# Patient Record
Sex: Female | Born: 1960 | Race: White | Hispanic: No | Marital: Married | Smoking: Former smoker
Health system: Southern US, Community
[De-identification: ages and names within clinical notes are randomized; demographics above are authoritative.]

## PROBLEM LIST (undated history)

## (undated) DIAGNOSIS — M5090 Cervical disc disorder, unspecified, unspecified cervical region: Secondary | ICD-10-CM

## (undated) DIAGNOSIS — K589 Irritable bowel syndrome without diarrhea: Secondary | ICD-10-CM

## (undated) DIAGNOSIS — D0590 Unspecified type of carcinoma in situ of unspecified breast: Secondary | ICD-10-CM

## (undated) DIAGNOSIS — Z923 Personal history of irradiation: Secondary | ICD-10-CM

## (undated) DIAGNOSIS — C4491 Basal cell carcinoma of skin, unspecified: Secondary | ICD-10-CM

## (undated) DIAGNOSIS — M1990 Unspecified osteoarthritis, unspecified site: Secondary | ICD-10-CM

## (undated) DIAGNOSIS — Z1211 Encounter for screening for malignant neoplasm of colon: Secondary | ICD-10-CM

## (undated) DIAGNOSIS — Z1371 Encounter for nonprocreative screening for genetic disease carrier status: Secondary | ICD-10-CM

## (undated) DIAGNOSIS — IMO0002 Reserved for concepts with insufficient information to code with codable children: Secondary | ICD-10-CM

## (undated) DIAGNOSIS — E669 Obesity, unspecified: Secondary | ICD-10-CM

## (undated) DIAGNOSIS — N879 Dysplasia of cervix uteri, unspecified: Secondary | ICD-10-CM

## (undated) DIAGNOSIS — G039 Meningitis, unspecified: Secondary | ICD-10-CM

## (undated) DIAGNOSIS — Z87891 Personal history of nicotine dependence: Secondary | ICD-10-CM

## (undated) DIAGNOSIS — C50919 Malignant neoplasm of unspecified site of unspecified female breast: Secondary | ICD-10-CM

## (undated) DIAGNOSIS — G43909 Migraine, unspecified, not intractable, without status migrainosus: Secondary | ICD-10-CM

## (undated) HISTORY — DX: Dysplasia of cervix uteri, unspecified: N87.9

## (undated) HISTORY — DX: Unspecified osteoarthritis, unspecified site: M19.90

## (undated) HISTORY — DX: Malignant neoplasm of unspecified site of unspecified female breast: C50.919

## (undated) HISTORY — PX: LASIK: SHX215

## (undated) HISTORY — DX: Encounter for nonprocreative screening for genetic disease carrier status: Z13.71

## (undated) HISTORY — PX: MOHS SURGERY: SUR867

## (undated) HISTORY — DX: Meningitis, unspecified: G03.9

## (undated) HISTORY — DX: Migraine, unspecified, not intractable, without status migrainosus: G43.909

## (undated) HISTORY — DX: Encounter for screening for malignant neoplasm of colon: Z12.11

## (undated) HISTORY — DX: Obesity, unspecified: E66.9

## (undated) HISTORY — PX: COLONOSCOPY: SHX174

## (undated) HISTORY — PX: LIPOMA EXCISION: SHX5283

## (undated) HISTORY — DX: Unspecified type of carcinoma in situ of unspecified breast: D05.90

## (undated) HISTORY — PX: BREAST LUMPECTOMY: SHX2

## (undated) HISTORY — PX: SKIN CANCER EXCISION: SHX779

## (undated) HISTORY — PX: CERVICAL DISC SURGERY: SHX588

## (undated) HISTORY — DX: Reserved for concepts with insufficient information to code with codable children: IMO0002

## (undated) HISTORY — DX: Personal history of nicotine dependence: Z87.891

## (undated) HISTORY — DX: Irritable bowel syndrome without diarrhea: K58.9

## (undated) HISTORY — DX: Basal cell carcinoma of skin, unspecified: C44.91

## (undated) HISTORY — DX: Cervical disc disorder, unspecified, unspecified cervical region: M50.90

---

## 1999-03-07 ENCOUNTER — Ambulatory Visit (HOSPITAL_COMMUNITY): Admission: RE | Admit: 1999-03-07 | Discharge: 1999-03-07 | Payer: Self-pay | Admitting: Internal Medicine

## 1999-03-07 ENCOUNTER — Encounter: Payer: Self-pay | Admitting: Internal Medicine

## 1999-05-01 ENCOUNTER — Other Ambulatory Visit: Admission: RE | Admit: 1999-05-01 | Discharge: 1999-05-01 | Payer: Self-pay | Admitting: Obstetrics and Gynecology

## 1999-07-24 ENCOUNTER — Other Ambulatory Visit: Admission: RE | Admit: 1999-07-24 | Discharge: 1999-07-24 | Payer: Self-pay | Admitting: Obstetrics and Gynecology

## 2000-02-26 ENCOUNTER — Encounter (INDEPENDENT_AMBULATORY_CARE_PROVIDER_SITE_OTHER): Payer: Self-pay | Admitting: *Deleted

## 2000-02-26 ENCOUNTER — Ambulatory Visit (HOSPITAL_BASED_OUTPATIENT_CLINIC_OR_DEPARTMENT_OTHER): Admission: RE | Admit: 2000-02-26 | Discharge: 2000-02-26 | Payer: Self-pay | Admitting: General Surgery

## 2000-03-26 ENCOUNTER — Other Ambulatory Visit: Admission: RE | Admit: 2000-03-26 | Discharge: 2000-03-26 | Payer: Self-pay | Admitting: Obstetrics and Gynecology

## 2000-07-23 ENCOUNTER — Other Ambulatory Visit: Admission: RE | Admit: 2000-07-23 | Discharge: 2000-07-23 | Payer: Self-pay | Admitting: Obstetrics and Gynecology

## 2000-11-18 ENCOUNTER — Encounter: Payer: Self-pay | Admitting: Internal Medicine

## 2001-04-12 ENCOUNTER — Other Ambulatory Visit: Admission: RE | Admit: 2001-04-12 | Discharge: 2001-04-12 | Payer: Self-pay | Admitting: Obstetrics and Gynecology

## 2001-06-03 ENCOUNTER — Encounter (INDEPENDENT_AMBULATORY_CARE_PROVIDER_SITE_OTHER): Payer: Self-pay | Admitting: Specialist

## 2001-06-03 ENCOUNTER — Ambulatory Visit (HOSPITAL_COMMUNITY): Admission: RE | Admit: 2001-06-03 | Discharge: 2001-06-03 | Payer: Self-pay | Admitting: Obstetrics and Gynecology

## 2001-09-23 ENCOUNTER — Other Ambulatory Visit: Admission: RE | Admit: 2001-09-23 | Discharge: 2001-09-23 | Payer: Self-pay | Admitting: Obstetrics and Gynecology

## 2002-02-10 ENCOUNTER — Ambulatory Visit (HOSPITAL_COMMUNITY): Admission: RE | Admit: 2002-02-10 | Discharge: 2002-02-10 | Payer: Self-pay | Admitting: Sports Medicine

## 2002-02-10 ENCOUNTER — Encounter: Payer: Self-pay | Admitting: Sports Medicine

## 2002-09-27 ENCOUNTER — Other Ambulatory Visit: Admission: RE | Admit: 2002-09-27 | Discharge: 2002-09-27 | Payer: Self-pay | Admitting: Obstetrics and Gynecology

## 2003-08-04 DIAGNOSIS — D0590 Unspecified type of carcinoma in situ of unspecified breast: Secondary | ICD-10-CM

## 2003-08-04 DIAGNOSIS — K589 Irritable bowel syndrome without diarrhea: Secondary | ICD-10-CM

## 2003-08-04 HISTORY — DX: Unspecified type of carcinoma in situ of unspecified breast: D05.90

## 2003-08-04 HISTORY — PX: BREAST BIOPSY: SHX20

## 2003-08-04 HISTORY — DX: Irritable bowel syndrome, unspecified: K58.9

## 2003-09-01 ENCOUNTER — Emergency Department (HOSPITAL_COMMUNITY): Admission: EM | Admit: 2003-09-01 | Discharge: 2003-09-01 | Payer: Self-pay | Admitting: Emergency Medicine

## 2003-11-20 ENCOUNTER — Other Ambulatory Visit: Admission: RE | Admit: 2003-11-20 | Discharge: 2003-11-20 | Payer: Self-pay | Admitting: Obstetrics and Gynecology

## 2004-04-03 HISTORY — PX: BREAST LUMPECTOMY W/ NEEDLE LOCALIZATION: SHX1266

## 2004-05-08 ENCOUNTER — Ambulatory Visit: Payer: Self-pay | Admitting: Radiation Oncology

## 2004-05-19 ENCOUNTER — Ambulatory Visit: Payer: Self-pay | Admitting: General Surgery

## 2004-06-03 ENCOUNTER — Ambulatory Visit: Payer: Self-pay | Admitting: Radiation Oncology

## 2004-07-03 ENCOUNTER — Ambulatory Visit: Payer: Self-pay | Admitting: Radiation Oncology

## 2004-08-03 ENCOUNTER — Ambulatory Visit: Payer: Self-pay | Admitting: Radiation Oncology

## 2004-08-03 HISTORY — PX: ABDOMINAL HYSTERECTOMY: SHX81

## 2004-10-01 ENCOUNTER — Ambulatory Visit: Payer: Self-pay | Admitting: General Surgery

## 2004-12-02 ENCOUNTER — Other Ambulatory Visit: Admission: RE | Admit: 2004-12-02 | Discharge: 2004-12-02 | Payer: Self-pay | Admitting: Obstetrics and Gynecology

## 2005-03-16 ENCOUNTER — Encounter (INDEPENDENT_AMBULATORY_CARE_PROVIDER_SITE_OTHER): Payer: Self-pay | Admitting: Specialist

## 2005-03-16 ENCOUNTER — Inpatient Hospital Stay (HOSPITAL_COMMUNITY): Admission: AD | Admit: 2005-03-16 | Discharge: 2005-03-18 | Payer: Self-pay | Admitting: Obstetrics and Gynecology

## 2005-04-10 ENCOUNTER — Ambulatory Visit: Payer: Self-pay | Admitting: General Surgery

## 2005-04-21 ENCOUNTER — Ambulatory Visit: Payer: Self-pay | Admitting: General Surgery

## 2005-06-01 ENCOUNTER — Ambulatory Visit: Payer: Self-pay | Admitting: Internal Medicine

## 2005-10-14 ENCOUNTER — Ambulatory Visit: Payer: Self-pay | Admitting: General Surgery

## 2006-03-01 ENCOUNTER — Other Ambulatory Visit: Admission: RE | Admit: 2006-03-01 | Discharge: 2006-03-01 | Payer: Self-pay | Admitting: Obstetrics and Gynecology

## 2006-05-03 ENCOUNTER — Ambulatory Visit: Payer: Self-pay | Admitting: General Surgery

## 2006-05-06 ENCOUNTER — Ambulatory Visit: Payer: Self-pay | Admitting: Rheumatology

## 2006-11-10 ENCOUNTER — Ambulatory Visit: Payer: Self-pay | Admitting: General Surgery

## 2007-05-02 ENCOUNTER — Other Ambulatory Visit: Admission: RE | Admit: 2007-05-02 | Discharge: 2007-05-02 | Payer: Self-pay | Admitting: Obstetrics and Gynecology

## 2007-05-04 ENCOUNTER — Ambulatory Visit: Payer: Self-pay | Admitting: General Surgery

## 2008-03-12 ENCOUNTER — Ambulatory Visit: Payer: Self-pay | Admitting: General Surgery

## 2008-04-13 DIAGNOSIS — K589 Irritable bowel syndrome without diarrhea: Secondary | ICD-10-CM | POA: Insufficient documentation

## 2008-04-13 DIAGNOSIS — C50919 Malignant neoplasm of unspecified site of unspecified female breast: Secondary | ICD-10-CM | POA: Insufficient documentation

## 2008-04-13 DIAGNOSIS — N809 Endometriosis, unspecified: Secondary | ICD-10-CM | POA: Insufficient documentation

## 2008-04-16 ENCOUNTER — Ambulatory Visit: Payer: Self-pay | Admitting: Internal Medicine

## 2008-05-11 ENCOUNTER — Ambulatory Visit: Payer: Self-pay | Admitting: Internal Medicine

## 2008-06-26 ENCOUNTER — Telehealth: Payer: Self-pay | Admitting: Internal Medicine

## 2008-08-03 DIAGNOSIS — C4491 Basal cell carcinoma of skin, unspecified: Secondary | ICD-10-CM

## 2008-08-03 HISTORY — DX: Basal cell carcinoma of skin, unspecified: C44.91

## 2008-08-03 LAB — CONVERTED CEMR LAB: Pap Smear: NORMAL

## 2008-11-16 ENCOUNTER — Ambulatory Visit: Payer: Self-pay | Admitting: Internal Medicine

## 2008-11-16 LAB — CONVERTED CEMR LAB
ALT: 20 units/L (ref 0–35)
AST: 21 units/L (ref 0–37)
Albumin: 3.5 g/dL (ref 3.5–5.2)
Alkaline Phosphatase: 63 units/L (ref 39–117)
BUN: 13 mg/dL (ref 6–23)
Basophils Absolute: 0 10*3/uL (ref 0.0–0.1)
Basophils Relative: 0 % (ref 0.0–3.0)
Bilirubin Urine: NEGATIVE
Bilirubin, Direct: 0.1 mg/dL (ref 0.0–0.3)
CO2: 33 meq/L — ABNORMAL HIGH (ref 19–32)
Calcium: 9.3 mg/dL (ref 8.4–10.5)
Chloride: 106 meq/L (ref 96–112)
Cholesterol: 174 mg/dL (ref 0–200)
Creatinine, Ser: 0.8 mg/dL (ref 0.4–1.2)
Eosinophils Absolute: 0.2 10*3/uL (ref 0.0–0.7)
Eosinophils Relative: 3.3 % (ref 0.0–5.0)
GFR calc non Af Amer: 81.28 mL/min (ref >60)
Glucose, Bld: 90 mg/dL (ref 70–99)
HCT: 39.2 % (ref 36.0–46.0)
HDL: 70.3 mg/dL (ref >39.00)
Hemoglobin, Urine: NEGATIVE
Hemoglobin: 13.5 g/dL (ref 12.0–15.0)
Ketones, ur: NEGATIVE mg/dL
LDL Cholesterol: 72 mg/dL (ref 0–99)
Leukocytes, UA: NEGATIVE
Lymphocytes Relative: 33.8 % (ref 12.0–46.0)
Lymphs Abs: 1.9 10*3/uL (ref 0.7–4.0)
MCHC: 34.4 g/dL (ref 30.0–36.0)
MCV: 90.3 fL (ref 78.0–100.0)
Monocytes Absolute: 0.6 10*3/uL (ref 0.1–1.0)
Monocytes Relative: 11.2 % (ref 3.0–12.0)
Neutro Abs: 2.9 10*3/uL (ref 1.4–7.7)
Neutrophils Relative %: 51.7 % (ref 43.0–77.0)
Nitrite: NEGATIVE
Platelets: 233 10*3/uL (ref 150.0–400.0)
Potassium: 4.5 meq/L (ref 3.5–5.1)
RBC: 4.34 M/uL (ref 3.87–5.11)
RDW: 12 % (ref 11.5–14.6)
Sodium: 144 meq/L (ref 135–145)
Specific Gravity, Urine: 1.01 (ref 1.000–1.030)
TSH: 1.43 microintl units/mL (ref 0.35–5.50)
Total Bilirubin: 0.5 mg/dL (ref 0.3–1.2)
Total CHOL/HDL Ratio: 2
Total Protein, Urine: NEGATIVE mg/dL
Total Protein: 6.5 g/dL (ref 6.0–8.3)
Triglycerides: 159 mg/dL — ABNORMAL HIGH (ref 0.0–149.0)
Urine Glucose: NEGATIVE mg/dL
Urobilinogen, UA: 0.2 (ref 0.0–1.0)
VLDL: 31.8 mg/dL (ref 0.0–40.0)
WBC: 5.6 10*3/uL (ref 4.5–10.5)
pH: 5.5 (ref 5.0–8.0)

## 2008-11-20 ENCOUNTER — Encounter: Payer: Self-pay | Admitting: Internal Medicine

## 2008-11-20 ENCOUNTER — Ambulatory Visit: Payer: Self-pay | Admitting: Internal Medicine

## 2008-11-20 ENCOUNTER — Telehealth: Payer: Self-pay | Admitting: Internal Medicine

## 2008-11-20 DIAGNOSIS — R5383 Other fatigue: Secondary | ICD-10-CM

## 2008-11-20 DIAGNOSIS — M545 Low back pain, unspecified: Secondary | ICD-10-CM | POA: Insufficient documentation

## 2008-11-20 DIAGNOSIS — R030 Elevated blood-pressure reading, without diagnosis of hypertension: Secondary | ICD-10-CM | POA: Insufficient documentation

## 2008-11-20 DIAGNOSIS — R5381 Other malaise: Secondary | ICD-10-CM | POA: Insufficient documentation

## 2008-11-20 DIAGNOSIS — R059 Cough, unspecified: Secondary | ICD-10-CM | POA: Insufficient documentation

## 2008-11-20 DIAGNOSIS — R05 Cough: Secondary | ICD-10-CM

## 2008-11-20 DIAGNOSIS — F411 Generalized anxiety disorder: Secondary | ICD-10-CM | POA: Insufficient documentation

## 2008-12-14 ENCOUNTER — Telehealth (INDEPENDENT_AMBULATORY_CARE_PROVIDER_SITE_OTHER): Payer: Self-pay | Admitting: *Deleted

## 2008-12-14 ENCOUNTER — Encounter (INDEPENDENT_AMBULATORY_CARE_PROVIDER_SITE_OTHER): Payer: Self-pay | Admitting: *Deleted

## 2008-12-14 DIAGNOSIS — R1084 Generalized abdominal pain: Secondary | ICD-10-CM | POA: Insufficient documentation

## 2009-01-09 ENCOUNTER — Telehealth: Payer: Self-pay | Admitting: Internal Medicine

## 2009-03-14 ENCOUNTER — Ambulatory Visit: Payer: Self-pay | Admitting: General Surgery

## 2009-03-26 ENCOUNTER — Encounter: Payer: Self-pay | Admitting: Internal Medicine

## 2009-04-02 ENCOUNTER — Encounter: Payer: Self-pay | Admitting: Internal Medicine

## 2009-04-02 ENCOUNTER — Ambulatory Visit: Payer: Self-pay | Admitting: General Surgery

## 2009-04-10 ENCOUNTER — Encounter: Payer: Self-pay | Admitting: Internal Medicine

## 2010-02-24 ENCOUNTER — Ambulatory Visit: Payer: Self-pay | Admitting: Unknown Physician Specialty

## 2010-03-24 ENCOUNTER — Ambulatory Visit: Payer: Self-pay | Admitting: General Surgery

## 2010-12-19 NOTE — Op Note (Signed)
[facility]  Patient:    Jacqueline Lewis, Jacqueline Lewis Visit Number: [id number] MRN: [id number]          Service Type: DSU Location: [facility] Attending Physician:  Leonard Schwartz Dictated by:   Janine Limbo, M.D. Proc. Date: 06/03/01 Admit Date:  06/03/2001 Discharge Date: 06/03/2001                             Operative Report  PREOPERATIVE DIAGNOSES:         1. Chronic pelvic pain.                                 2. Irregular menstrual cycles.                                 3. Endometriosis.                                 4. Pelvic adhesions.  POSTOPERATIVE DIAGNOSES:        1. Chronic pelvic pain.                                 2. Irregular menstrual cycles.                                 3. Endometriosis.                                 4. Pelvic adhesions.  OPERATION:                      1. Hysteroscopy.                                 2. Dilatation and curettage.                                 3. Diagnostic laparoscopy.                                 4. Laparoscopic pelvic biopsies.                                 5. Laparoscopic resection of                                    endometriosis.                                 6. Laparoscopic ablation of                                    endometriosis.  7. Laparoscopic lysis of                                    adhesions.  SURGEON:                        Janine Limbo, M.D.  FIRST ASSISTANT:                None.  ANESTHESIA:                     General.  INDICATIONS:                    Jacqueline Lewis is a 50 year old female, gravida 0 who presents with the above mentioned diagnosis.  The pain medications have not resolved her discomfort.  The patient understands the indications for her surgical procedure and she accepts the risks of, but not limited to, anesthetic complications, bleeding, infections, and possible damage to the surrounding  organs.  FINDINGS:                       The uterus was normal size.  The fallopian tubes were normal except for adhesions between the left fallopian tube and the bowel.  The fimbriated ends of the fallopian tubes were delicate.  The ovaries were normal.  There were, however, multiple implants of endometriosis on the surface of the ovaries.  The appendix appeared normal.  The liver was normal. The gallbladder appeared slightly distended but was thought to be within normal limits.  There were adhesions between the large bowel and the left broad ligament.  There were adhesions between the large bowel and the right broad ligament.  There was an implant of endometriosis in the anterior cul-de-sac measuring approximately 1 cm in size.  It was to the left of the midline.  There were implants of endometriosis in the posterior cul-de-sac. There was a lesion measuring 3.0 cm and it was located to the left of the uterosacral ligament.  There were smaller implants of endometriosis in the posterior cul-de-sac to the left of the uterosacral ligament.  There were areas of endometriosis in the posterior cul-de-sac to the right of the uterosacral ligament measuring approximately 0.3 cm in size.  There were no adhesions in the posterior cul-de-sac per say.  There were no adhesions in the anterior cul-de-sac per say.  The fallopian tubes and the ovaries were freely mobile in spite of the endometriosis present.  At the end of our procedure, the major areas of endometriosis had been resected.  The adhesions had been lysed.  The ureters were identified and followed up through their course in the pelvis and were noted to be without harm.  On hysteroscopy there were no pathologic appearing lesions noted within the endometrial cavity.  There was an abundance of endometrial tissue located posteriorly in the uterus and following dilatation and curettage, the cavity was felt to be clean.  DESCRIPTION OF PROCEDURE:   The patient was taken to the operating room where a general anesthetic was given.  The patients abdomen, perineum, and vagina were prepped with multiple layers of Betadine.  The patients abdomen, perineum, and vagina were prepped with multiple layers of Betadine.  A Foley catheter was placed in the bladder.  The patient was sterilely draped. Examination under anesthesia was performed.  A paracervical block was placed  using 10 cc of 0.5% Marcaine without epinephrine.  An endocervical curettage was obtained.  The uterus was sounded to 7.5 cm.  The cervix was gradually dilated.  The diagnostic hysteroscope was inserted and the cavity was inspected.  Findings are mentioned above.  Pictures were taken of the patients intracavitary anatomy.  The hysteroscope was removed, and the cavity was curetted using a medium sharp curet.  The cavity was felt to be clean at the end of our procedure.  The hysteroscope was again inserted, and I confirmed that the cavity was basically clean.  Again, there were no signs of pathology within the uterine cavity.  These instruments were removed, and a Hulka tenaculum was placed inside the uterus.  The operator changed gloves and we proceeded with our diagnostic laparoscopy.  The subumbilical area was injected with 5 cc of 0.25% Marcaine.  An incision was made and the Verres needle was inserted into the abdominal cavity without difficulty.  Proper placement was confirmed using the saline drop test.  A pneumoperitoneum was then obtained.  The laparoscopic trocar and then the laparoscope were substituted for the Verres needle.  The pelvic structures were visualized with findings as mentioned above.  The suprapubic area was then injected in two separate places.  Two incisions were made and two 5 mm trocars were placed into the pelvis.  Pictures were taken of the patients pelvic anatomy.  The endometriosis noted above was documented.  The areas of endometriosis in  the anterior cul-de-sac were then injected with saline.  The areas were biopsied away using sharp biopsy instruments.  Hemostasis was adequate.  The  endometrial implant located to the left of the uterosacral ligament posteriorly was then injected with saline.  A biopsy was obtained.  Hemostasis was adequate.  An area of endometriosis in the right cul-de-sac was then injected.  A biopsy was obtained and again hemostasis was noted to be adequate.  Care was taken not to damage the underlying vital structures.  We then used the harmonic scalpel to ablate the smaller foci of endometriosis located in the anterior cul-de-sac and in the posterior cul-de-sac.  The adhesions between the bowel and the side walls were then lysed using a combination of sharp dissection, blunt dissection and dissection using the harmonic scalpel.  The pelvis was then irrigated.  At this point, we felt that our surgery had been completed.  There was no evidence of bleeding.  There was no evidence of damage to the bowel or any of the structures.  The pneumoperitoneum was allowed to escape.  The incisions were closed using deep and superficial sutures of 4-0 Vicryl.  Sponge, needle and instrument counts were correct on two occasions.  The estimated blood loss was 20 cc.  The patient tolerated her procedure well.  The patient was taken to the recovery room after her anesthetic was reversed.  She was noted to be in stable condition.  She was noted to be draining clear yellow urine.  FOLLOW-UP INSTRUCTIONS:  The patient will return to see Dr. Stefano Gaul in two to three weeks for follow-up examination.  She was given a prescription for Vicodin which she will take one or two tablets every four hours as needed for pain.  She was given a copy of the postoperative instruction sheet as prepared by the [facility] for patients who have undergone a diagnostic laparoscopy as well as dilatation and curettage.  She  will call for questions or concerns. Dictated by:   Merton Border  Zack Seal, M.D. Attending Physician:  Leonard Schwartz DD:  06/04/01 TD:  06/05/01 Job: [id number]

## 2010-12-19 NOTE — H&P (Signed)
NAME:  Jacqueline Lewis, Jacqueline Lewis             ACCOUNT NO.:  [id number]   MEDICAL RECORD NO.:  [id number]          PATIENT TYPE:  INP   LOCATION:  NA                            FACILITY:  [facility]   PHYSICIAN:  James A. Ashley Royalty, M.D.DATE OF BIRTH:  [birth year]   DATE OF ADMISSION:  03/16/2005  DATE OF DISCHARGE:                                HISTORY & PHYSICAL   HISTORY OF PRESENT ILLNESS:  This is a 50 year old nulligravida who  presented to me May 2006 complaining of history of endometriosis and severe  dysmenorrhea.  She also has breast cancer-estrogen receptor positive.  She  desires definitive therapy for the endometriosis in the form of TAH BSO.  She also states that she wants to take Arimidex, antiestrogen, and that this  definitive procedure wound depend on her Jacqueline Lewis Oncologist to make that  possible as well.   MEDICATIONS:  1.  Xanax p.r.n.  2.  Mobic (?) 15 mg.  3.  Ponstel 250 mg p.o. t.i.d. p.r.n.  4.  Levsin for irritable bowel syndrome.   PAST MEDICAL HISTORY:  1.  Irritable bowel syndrome.  2.  Breast carcinoma.  3.  Degenerative joint disease.   PAST SURGICAL HISTORY:  1.  LEEP.  2.  Laparoscopy.  3.  Breast surgery (lumpectomy).  4.  Lipoma.   ALLERGIES:  None.   FAMILY HISTORY:  Noncontributory.   SOCIAL HISTORY:  The patient is a one pack per day smoker.  She is a social  drinker.   REVIEW OF SYSTEMS:  Noncontributory.   PHYSICAL EXAMINATION:  GENERAL APPEARANCE:  Well-developed, well-nourished,  pleasant white female in no acute distress.  VITAL SIGNS:  Afebrile, vital signs stable.  SKIN:  Warm and dry without lesions.  LYMPH:  No supraclavicular, cervical or inguinal adenopathy.  HEENT:  Normocephalic, atraumatic.  NECK:  Supple without thyromegaly.  CHEST:  Lungs are clear.  CARDIAC:  Regular rate and rhythm.  BREAST:  Deferred.  ABDOMEN:  Soft and tenderness without masses or organomegaly.  Bowel sounds  are active.  MUSCULOSKELETAL:  Full range of  motion.  PELVIC:  External genitalia within normal limits.  Vaginal and cervix are  without lesions.  Bimanual examination reveals the uterus to be  approximately 8x4x4 cm.  No adnexal masses are palpable.   IMPRESSION:  1.  Endometriosis.  2.  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  3.  Dysmenorrhea-severe-probably secondary to #1.  4.  Back pain, probably secondary to #1.   PLAN:  1.  Total abdominal hysterectomy.  2.  Bilateral salpingo-oophorectomy.  Benefits, complications and      alternatives were discussed with the patient.  She states she      understands and accepts.  Questions invited and answered.       JAM/MEDQ  D:  03/16/2005  T:  03/16/2005  Job:  [id number]
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
## 2010-12-19 NOTE — Op Note (Signed)
[facility]  Patient:    Jacqueline Lewis, Jacqueline Lewis                      MRN: [id number] Proc. Date: 02/26/00 Adm. Date:  [id number] Disc. Date: [id number] Attending:  Carson Myrtle                           Operative Report  PREOPERATIVE DIAGNOSIS:  Lipoma right back.  POSTOPERATIVE DIAGNOSIS:  Lipoma right back.  SURGEON:  Timothy E. Earlene Plater, M.D.  PROCEDURE:  Excision of lipoma.  ANESTHESIA:  CRNA standby.  BRIEF HISTORY:  Ms. Curiale is an otherwise healthy 50 year old female who has an enlarging painful mass in the right lower back.  It appears to be a lipoma.  She has requested removal, and this has been planned for today.  PROCEDURE IN DETAIL:  The patient was brought to the operating room and placed supine, IV started, and sedation given.  She was then turned prone and she comforted herself and was carefully padded.  More IV sedation was given.  The right back had been marked to her and my satisfaction, and it was prepped and draped in the usual fashion.  Marcaine 0.25% with epinephrine was used throughout for local anesthesia.  Over the palpable defect, Marcaine was injected.  Then a straight incision was made horizontally.  The mass was identified in the deep subcutaneous space as a large globular lipoma, approximately 3 x 4 cm.  This was bluntly dissected from the surrounding tissue.  Cautery was used when necessary, and the lipoma was removed.  The space was dry.  It was partially closed with a 3-0 Monocryl.  The skin was closed with 3-0 Monocryl.  She tolerated it well.  Steri-Strips and dry sterile dressings were applied.  Written and verbal instructions were given her and her husband, and she will be seen and followed as an outpatient. DD:  02/26/00 TD:  02/27/00 Job: [id number]

## 2010-12-19 NOTE — Op Note (Signed)
NAME:  Jacqueline Lewis, Jacqueline Lewis             ACCOUNT NO.:  [id number]   MEDICAL RECORD NO.:  [id number]          PATIENT TYPE:  INP   LOCATION:  9399                          FACILITY:  [facility]   PHYSICIAN:  Rudy Jew. Ashley Royalty, M.D.DATE OF BIRTH:  11-02-1960   DATE OF PROCEDURE:  03/16/2005  DATE OF DISCHARGE:                                 OPERATIVE REPORT   PREOPERATIVE DIAGNOSES:  1.  Endometriosis.  2.  Breast carcinoma.   POSTOP DIAGNOSIS:  1.  Endometriosis.  2.  Breast carcinoma.  Pathology pending.   PROCEDURE:  1.  Total abdominal hysterectomy.  2.  Bilateral salpingo-oophorectomy.   SURGEON:  Rudy Jew. Ashley Royalty, M.D.   ASSISTANT:  Bing Neighbors. Delcambre, MD   ANESTHESIA:  General.   ESTIMATED BLOOD LOSS:  150 mL.   COMPLICATIONS:  None.   PACKS AND DRAINS:  Foley.   Sponge, needle and instrument counts were reported as correct x 2.   PROCEDURE:  The patient was taken to the operating room, placed in dorsal  supine position. After general anesthetic was administered, she was prepped  for abdominal surgery. The vagina was prepped as well. Foley catheter was  placed. She was then draped for surgery.   A Pfannenstiel incision was made down to the level of the fascia which was  nicked with knife and incised transversely with Mayo scissors. The  underlying rectus muscles were separated from the overlying fascia using  sharp and blunt dissection. The rectus muscles were separated in the midline  exposing the peritoneum was elevated with hemostats and entered  atraumatically with Metzenbaum scissors. Incision was extended  longitudinally. The pelvis was identified and the uterus was noted to be  quite small. The ovaries, fallopian tubes were also quite small. There were  some adhesions from the colon to the left pelvic sidewall that required  lysis prior to packing the GI tract cephalad. This was accomplished without  difficulty. The Balfour retractor was then placed and the  upper abdomen  packed off. Prior to packing the upper abdomen off, it was inspected. The  peritoneum was smooth. There was no appreciable adenopathy. The liver  surface was smooth as well. Next the uterus was grasped with Tresa Endo clamps.  The round ligaments were clamped, cut and secured bilaterally with 0 Vicryl.  The broad ligament was opened and the bladder flap created by incising  anterior uterine serosa and sharp and blunt dissection of bladder  inferiorly. The infundibulopelvic ligaments were isolated. The ureters were  noted to be well below the plane of dissection. The infundibulopelvic  ligaments were bilaterally clamped, cut and secured with zero Vicryl. The  uterine vessels were skeletonized and bilaterally clamped, cut and secured  with #1 Vicryl. The cardinal ligaments were then clamped, cut and secured  with 0 Vicryl. Uterosacral ligaments were serially clamped, cut and secured.  The vaginal angles were secured with Heaneys and the vagina entered. The  cervix was excised with Satinsky scissors and the entire specimen submitted  to pathology for histologic studies. Angle sutures were placed using 0  Vicryl. One interrupted figure-of-eight suture was required in  the midline  to completely close the vagina. This was accomplished without difficulty and  hemostasis was noted. Copious irrigation was accomplished. Small bleeder on  the left angle was easily controlled with a right-angle clamp and secured  with 0 Vicryl.   At this point all pedicles were inspected and hemostasis was noted. Copious  irrigation was accomplished. Hemostasis was once again noted. All packs were  removed and the retractor was removed. The peritoneum was closed with a 3-0  Vicryl in a running fashion. The fascia was closed with 0 Vicryl in a  running fashion. The skin was closed with staples. The patient tolerated  procedure extremely well and was returned to the recovery room in good  condition.   At  the conclusion of the procedure, the urine was clear and copious.       JAM/MEDQ  D:  03/16/2005  T:  03/16/2005  Job:  [id number]

## 2010-12-19 NOTE — Discharge Summary (Signed)
NAME:  Jacqueline Lewis, CABAL             ACCOUNT NO.:  [id number]   MEDICAL RECORD NO.:  [id number]          PATIENT TYPE:  INP   LOCATION:  9318                          FACILITY:  [facility]   PHYSICIAN:  Rudy Jew. Ashley Royalty, M.D.DATE OF BIRTH:  12-Oct-1960   DATE OF ADMISSION:  03/16/2005  DATE OF DISCHARGE:  03/18/2005                                 DISCHARGE SUMMARY   DISCHARGE DIAGNOSES:  1.  Endometriosis.  2.  Dysmenorrhea, severe.  3.  Back pain, probably secondary to endometriosis.  4.  Left-sided breast cancer, ductal carcinoma in situ.   OPERATION/PROCEDURE:  1.  Total abdominal hysterectomy.  2.  Bilateral salpingo-oophorectomy.   CONSULTATIONS:  None.   DISCHARGE MEDICATIONS:  Percocet, Motrin.   HISTORY AND PHYSICAL:  This is a 50 year old nulligravida who presented in  May 2006 complaining of endometriosis with severe dysmenorrhea.  She also  has breast cancer, estrogen-receptor positive.  She desires to undergo for  the endometriosis in the form of TAHBSO.  She also states that she wants to  take Arimidex and this procedure will allow that to be administered by her  medical oncologist as well.  For the remainder of the history and physical,  please see the chart.   HOSPITAL COURSE:  The patient is admitted to [facility] [place].  Admission laboratory studies were drawn.  On March 16, 2005, she was taken  to the operating room and underwent total abdominal hysterectomy, bilateral  salpingo-oophorectomy.  The procedure was uncomplicated.  The patient's  postoperative course was similarly uncomplicated.  She was discharged on the  second postoperative day afebrile in satisfactory condition.  The pathology  report revealed endometriosis, chronic cervicitis.   DISPOSITION:  The patient is to return to the office in approximately two  days for staple removal.           ______________________________  Rudy Jew. Ashley Royalty, M.D.     JAM/MEDQ  D:  05/25/2005  T:   05/26/2005  Job:  [id number]

## 2011-04-30 ENCOUNTER — Ambulatory Visit: Payer: Self-pay | Admitting: Gynecology

## 2011-06-03 ENCOUNTER — Other Ambulatory Visit: Payer: Self-pay | Admitting: Gynecology

## 2011-08-04 DIAGNOSIS — Z1211 Encounter for screening for malignant neoplasm of colon: Secondary | ICD-10-CM

## 2011-08-04 HISTORY — DX: Encounter for screening for malignant neoplasm of colon: Z12.11

## 2012-05-25 ENCOUNTER — Ambulatory Visit: Payer: Self-pay | Admitting: General Surgery

## 2012-10-18 ENCOUNTER — Ambulatory Visit (INDEPENDENT_AMBULATORY_CARE_PROVIDER_SITE_OTHER): Payer: BC Managed Care – PPO | Admitting: Gynecology

## 2012-10-18 ENCOUNTER — Encounter: Payer: Self-pay | Admitting: Gynecology

## 2012-10-18 VITALS — BP 136/84 | Ht 65.0 in | Wt 158.0 lb

## 2012-10-18 DIAGNOSIS — C801 Malignant (primary) neoplasm, unspecified: Secondary | ICD-10-CM | POA: Insufficient documentation

## 2012-10-18 DIAGNOSIS — Z01419 Encounter for gynecological examination (general) (routine) without abnormal findings: Secondary | ICD-10-CM

## 2012-10-18 DIAGNOSIS — N879 Dysplasia of cervix uteri, unspecified: Secondary | ICD-10-CM | POA: Insufficient documentation

## 2012-10-18 DIAGNOSIS — G4700 Insomnia, unspecified: Secondary | ICD-10-CM

## 2012-10-18 MED ORDER — ALPRAZOLAM 0.5 MG PO TABS: 0.5000 mg | ORAL_TABLET | Freq: Every evening | ORAL | Status: DC | PRN

## 2012-10-18 MED ORDER — TRAZODONE HCL 50 MG PO TABS: 50.0000 mg | ORAL_TABLET | Freq: Every day | ORAL | Status: DC

## 2012-10-18 NOTE — Patient Instructions (Signed)
Follow up in one year for annual exam 

## 2012-10-18 NOTE — Progress Notes (Signed)
Jacqueline Lewis April 24, 1961 [id number]        53 y.o.  G0P0 new patient for annual exam.  Former patient of Dr. Nicholas Lose. Several issues noted below.  Past medical history,surgical history, medications, allergies, family history and social history were all reviewed and documented in the EPIC chart. ROS:  Was performed and pertinent positives and negatives are included in the history.  Exam: Kim assistant Filed Vitals:   10/18/12 1539  BP: 136/84  Height: 5\' 5"  (1.651 m)  Weight: 158 lb (71.668 kg)   General appearance  Normal Skin grossly normal Head/Neck normal with no cervical or supraclavicular adenopathy thyroid normal Lungs  clear Cardiac RR, without RMG Abdominal  soft, nontender, without masses, organomegaly or hernia Breasts  examined lying and sitting without masses, retractions, discharge or axillary adenopathy. Well-healed lumpectomy scar left breast. Pelvic  Ext/BUS/vagina  normal with mild atrophic changes   Adnexa  Without masses or tenderness    Anus and perineum  normal   Rectovaginal  normal sphincter tone without palpated masses or tenderness.    Assessment/Plan:  52 y.o. G0P0 new patient for annual exam.   1. History of breast cancer status post lumpectomy/radiation treatment 2005. No subsequent therapy. Doing well with NED. Mammography 03/2012. Continue with annual mammography. SBE monthly reviewed. 2. Status post TAH/BSO 2006. Historically sounds like endometriosis. Is doing well without significant hot flushes sweats or vaginal symptoms. Continue to monitor. 3. Pap smear 2012. No Pap smear done today. History of dysplasia as a younger woman requiring repeat Pap smears but no treatment. All Paps after her hysterectomy have been normal. We'll plan repeat next year at three-year interval, discussed possible stop screening altogether. 4. Colonoscopy 2009. Repeat at 10 year interval. 5. DEXA never. Plan Baseline in another year or 2 at 10 years from her hysterectomy  BSO. Increase calcium vitamin D reviewed. 6. Chronic insomnia. Has had a problem with chronic insomnia ever since her radiation treatment. Haven't tried a variety of medications including Ambien Lunesta which caused side effects. Currently on trazodone 50 mg nightly per Dr. Nicholas Lose and doing well with this. Does use Xanax when necessary. Is comfortable continuing. Discussed possible sleep clinic referral. Patient would prefer to continue on medication at this time. Apparently also been addressed by her oncologist whose was comfortable with the regiment.  Refill trazodone #90 with 4 refills. Xanax 0.5 mg #30 with 4 refills. 7. Health maintenance. Patient having blood work screening at work. The blood work done today. Blood pressure 136/84. Aspiration have recheck in a not exam situation. It would remain elevated she will need to see her primary. Assuming she continues well from a gynecologic standpoint and followup in one year, sooner as needed.     Dara Lords MD, 4:38 PM 10/18/2012

## 2012-10-19 LAB — URINALYSIS W MICROSCOPIC + REFLEX CULTURE
Glucose, UA: NEGATIVE mg/dL
Leukocytes, UA: NEGATIVE
Protein, ur: NEGATIVE mg/dL
Squamous Epithelial / LPF: NONE SEEN
Urobilinogen, UA: 0.2 mg/dL (ref 0.0–1.0)

## 2012-10-23 ENCOUNTER — Other Ambulatory Visit: Payer: Self-pay | Admitting: Gynecology

## 2012-10-24 ENCOUNTER — Telehealth: Payer: Self-pay

## 2012-10-24 NOTE — Telephone Encounter (Signed)
Patient was in march 18 and was prescribed this but the Rx went to "print" and cannot be electronically sent.  Rx was given to pharmacist by phone today.

## 2012-10-24 NOTE — Telephone Encounter (Signed)
Patient called to say that she went to pharmacy after her visit and her Trazadone was there but not her Xanax. I told her I spoke with pharmacy this morning and gave them the Rx for Xanax.  I explained to her that Xanax will not go electronically and that it was sent to print . I told her if she received the written one she needed to shred it and she said she would.

## 2012-12-01 HISTORY — PX: NASAL SEPTUM SURGERY: SHX37

## 2012-12-07 ENCOUNTER — Ambulatory Visit: Payer: Self-pay | Admitting: Otolaryngology

## 2013-01-23 ENCOUNTER — Encounter: Payer: Self-pay | Admitting: *Deleted

## 2013-01-23 DIAGNOSIS — D0590 Unspecified type of carcinoma in situ of unspecified breast: Secondary | ICD-10-CM | POA: Insufficient documentation

## 2013-02-24 ENCOUNTER — Telehealth: Payer: Self-pay | Admitting: *Deleted

## 2013-02-24 NOTE — Telephone Encounter (Signed)
Pt called was given Desyrel 50 mg 1 po daily, on OV 10/18/12  but she takes twice nightly and has always taken this way. Pt asked if this could be written 2 tablets night? Please advise

## 2013-02-27 MED ORDER — TRAZODONE HCL 50 MG PO TABS: ORAL_TABLET | ORAL | Status: DC

## 2013-02-27 NOTE — Telephone Encounter (Signed)
Left message on voicemail this has been done. rx sent.

## 2013-02-27 NOTE — Telephone Encounter (Signed)
2 by mouth each bedtime when necessary insomnia #60 refill x5

## 2013-04-06 ENCOUNTER — Other Ambulatory Visit: Payer: Self-pay | Admitting: Gynecology

## 2013-04-06 NOTE — Telephone Encounter (Signed)
Called in KW 

## 2013-05-03 DIAGNOSIS — G039 Meningitis, unspecified: Secondary | ICD-10-CM

## 2013-05-03 HISTORY — PX: LUMBAR PUNCTURE: SHX1985

## 2013-05-03 HISTORY — DX: Meningitis, unspecified: G03.9

## 2013-05-15 ENCOUNTER — Emergency Department: Payer: Self-pay | Admitting: Emergency Medicine

## 2013-05-15 LAB — CBC WITH DIFFERENTIAL/PLATELET
Eosinophil #: 0 10*3/uL (ref 0.0–0.7)
Eosinophil %: 0.1 %
HCT: 41.8 % (ref 35.0–47.0)
HGB: 14.6 g/dL (ref 12.0–16.0)
Lymphocyte #: 1.6 10*3/uL (ref 1.0–3.6)
Lymphocyte %: 21.4 %
MCH: 30.8 pg (ref 26.0–34.0)
MCHC: 34.9 g/dL (ref 32.0–36.0)
MCV: 88 fL (ref 80–100)
Monocyte #: 0.7 x10 3/mm (ref 0.2–0.9)
Monocyte %: 9.9 %
Neutrophil %: 68.2 %
Platelet: 212 10*3/uL (ref 150–440)
RBC: 4.73 10*6/uL (ref 3.80–5.20)
RDW: 12.8 % (ref 11.5–14.5)
WBC: 7.3 10*3/uL (ref 3.6–11.0)

## 2013-05-15 LAB — BASIC METABOLIC PANEL
BUN: 9 mg/dL (ref 7–18)
Co2: 26 mmol/L (ref 21–32)
Creatinine: 0.86 mg/dL (ref 0.60–1.30)
EGFR (African American): >60
Glucose: 91 mg/dL (ref 65–99)
Osmolality: 270 (ref 275–301)

## 2013-05-15 LAB — RAPID INFLUENZA A&B ANTIGENS

## 2013-05-16 LAB — CBC
HCT: 40.3 % (ref 35.0–47.0)
HGB: 14.3 g/dL (ref 12.0–16.0)
MCH: 30.9 pg (ref 26.0–34.0)
MCHC: 35.4 g/dL (ref 32.0–36.0)
MCV: 87 fL (ref 80–100)
Platelet: 249 10*3/uL (ref 150–440)
RBC: 4.62 10*6/uL (ref 3.80–5.20)
RDW: 13 % (ref 11.5–14.5)

## 2013-05-16 LAB — COMPREHENSIVE METABOLIC PANEL
Albumin: 3.8 g/dL (ref 3.4–5.0)
Alkaline Phosphatase: 84 U/L (ref 50–136)
BUN: 8 mg/dL (ref 7–18)
Calcium, Total: 9.2 mg/dL (ref 8.5–10.1)
EGFR (African American): >60
EGFR (Non-African Amer.): >60
Glucose: 123 mg/dL — ABNORMAL HIGH (ref 65–99)
Osmolality: 277 (ref 275–301)
SGOT(AST): 27 U/L (ref 15–37)
Total Protein: 7.4 g/dL (ref 6.4–8.2)

## 2013-05-17 ENCOUNTER — Inpatient Hospital Stay: Payer: Self-pay | Admitting: Internal Medicine

## 2013-05-17 LAB — CSF CELL COUNT WITH DIFFERENTIAL
CSF Tube #: 4
Eosinophil: 0 %
Lymphocytes: 85 %
Monocytes/Macrophages: 7 %
Neutrophils: 8 %
Other Cells: 0 %
RBC (CSF): 593 /mm3
WBC (CSF): 118 /mm3

## 2013-05-17 LAB — CSF CELL CT + PROT + GLU PANEL
CSF Tube #: 3
Glucose, CSF: 65 mg/dL (ref 40–75)
Monocytes/Macrophages: 12 %
Neutrophils: 21 %
Other Cells: 0 %

## 2013-05-18 LAB — CBC WITH DIFFERENTIAL/PLATELET
Eosinophil #: 0.1 10*3/uL (ref 0.0–0.7)
Eosinophil %: 1 %
HGB: 13.8 g/dL (ref 12.0–16.0)
Lymphocyte #: 3.3 10*3/uL (ref 1.0–3.6)
Lymphocyte %: 44.5 %
MCH: 31.1 pg (ref 26.0–34.0)
Monocyte #: 0.7 x10 3/mm (ref 0.2–0.9)
Neutrophil %: 43.7 %
Platelet: 214 10*3/uL (ref 150–440)
RBC: 4.44 10*6/uL (ref 3.80–5.20)
WBC: 7.4 10*3/uL (ref 3.6–11.0)

## 2013-05-18 LAB — BASIC METABOLIC PANEL
BUN: 7 mg/dL (ref 7–18)
Chloride: 103 mmol/L (ref 98–107)
Creatinine: 0.81 mg/dL (ref 0.60–1.30)
EGFR (Non-African Amer.): >60
Osmolality: 272 (ref 275–301)
Potassium: 3.3 mmol/L — ABNORMAL LOW (ref 3.5–5.1)
Sodium: 137 mmol/L (ref 136–145)

## 2013-05-24 ENCOUNTER — Telehealth: Payer: Self-pay | Admitting: Internal Medicine

## 2013-05-24 NOTE — Telephone Encounter (Signed)
Ok with me 

## 2013-05-24 NOTE — Telephone Encounter (Signed)
Pt was seen one time as a new pt in April 2010.  She was admitted to the hospital and needs a follow up.  Is it OK to re-est?

## 2013-05-24 NOTE — Telephone Encounter (Signed)
Pt is aware.  Appt on Oct 30.

## 2013-06-01 ENCOUNTER — Ambulatory Visit (INDEPENDENT_AMBULATORY_CARE_PROVIDER_SITE_OTHER): Payer: BC Managed Care – PPO | Admitting: Internal Medicine

## 2013-06-01 ENCOUNTER — Encounter: Payer: Self-pay | Admitting: Internal Medicine

## 2013-06-01 VITALS — BP 110/80 | HR 75 | Temp 97.9°F | Ht 64.0 in | Wt 168.0 lb

## 2013-06-01 DIAGNOSIS — Z0000 Encounter for general adult medical examination without abnormal findings: Secondary | ICD-10-CM

## 2013-06-01 DIAGNOSIS — Z23 Encounter for immunization: Secondary | ICD-10-CM

## 2013-06-01 MED ORDER — ALPRAZOLAM 0.5 MG PO TABS: ORAL_TABLET | ORAL | Status: DC

## 2013-06-01 NOTE — Patient Instructions (Addendum)
Please fax information you may have to [phone] You had the tetanus shot today Please continue all other medications as before, and refills have been done if requested - the xanax Please have the pharmacy call with any other refills you may need. Please continue your efforts at being more active, low cholesterol diet, and weight control. You are otherwise up to date with prevention measures today.  Please remember to sign up for My Chart if you have not done so, as this will be important to you in the future with finding out test results, communicating by private email, and scheduling acute appointments online when needed.

## 2013-06-01 NOTE — Progress Notes (Signed)
Subjective:    Patient ID: Jacqueline Lewis, female    DOB: May 16, 1961, 52 y.o.   MRN: [id number]  HPI  Here for wellness and f/u;  Overall doing ok;  Pt denies CP, worsening SOB, DOE, wheezing, orthopnea, PND, worsening LE edema, palpitations, dizziness or syncope.  Pt denies neurological change such as new headache, facial or extremity weakness.  Pt denies polydipsia, polyuria, or low sugar symptoms. Pt states overall good compliance with treatment and medications, good tolerability, and has been trying to follow lower cholesterol diet.  Pt denies worsening depressive symptoms, suicidal ideation or panic. No fever, night sweats, wt loss, loss of appetite, or other constitutional symptoms.  Pt states good ability with ADL's, has low fall risk, home safety reviewed and adequate, no other significant changes in hearing or vision, and only occasionally active with exercise.  Recently tx for viral meningitis at [facility]. Declines flu shot. Past Medical History  Diagnosis Date  . Cervical dysplasia     young age, no treatment  . Cancer 2005    Left Breast cancer  . Basal cell cancer 2010    Left upper arm  . Personal history of tobacco use, presenting hazards to health   . Special screening for malignant neoplasms, colon 2013  . Carcinoma in situ of breast 2005  . Obesity, unspecified   . IBS (irritable bowel syndrome) 2005  . Arthritis    Past Surgical History  Procedure Laterality Date  . Oophorectomy      BSO  . Lipoma excision    . Abdominal hysterectomy  2006    TAH BSO  . Colonoscopy  2008    [facility]  . Breast surgery Left     Lumpectomy  . Laparoscopy  2005  . Breast biopsy  2005    reports that she has been smoking Cigarettes.  She has been smoking about 0.50 packs per day. She does not have any smokeless tobacco history on file. She reports that she drinks alcohol. She reports that she does not use illicit drugs. family history includes Breast cancer (age of onset:  99) in her maternal aunt; Cancer in her father and other; Hyperlipidemia in her mother; Hypertension in her father and mother. Allergies  Allergen Reactions  . Codeine Nausea Only   Current Outpatient Prescriptions on File Prior to Visit  Medication Sig Dispense Refill  . aspirin 81 MG tablet Take 81 mg by mouth daily.      . cyclobenzaprine (FLEXERIL) 10 MG tablet Take 10 mg by mouth 3 (three) times daily as needed for muscle spasms.      Marland Kitchen HYDROCODONE-ACETAMINOPHEN PO Take by mouth.      . meloxicam (MOBIC) 7.5 MG tablet Take 7.5 mg by mouth daily.      Marland Kitchen omeprazole (PRILOSEC) 40 MG capsule Take 40 mg by mouth daily.      Marland Kitchen PRESCRIPTION MEDICATION RX Nasal spray      . Probiotic Product (PROBIOTIC PO) Take by mouth.      . traZODone (DESYREL) 50 MG tablet Take 2 tablets by mouth at bedtime when necessary for insomnia  60 tablet  5   No current facility-administered medications on file prior to visit.     Review of Systems Constitutional: Negative for diaphoresis, activity change, appetite change or unexpected weight change.  HENT: Negative for hearing loss, ear pain, facial swelling, mouth sores and neck stiffness.   Eyes: Negative for pain, redness and visual disturbance.  Respiratory: Negative for shortness  of breath and wheezing.   Cardiovascular: Negative for chest pain and palpitations.  Gastrointestinal: Negative for diarrhea, blood in stool, abdominal distention or other pain Genitourinary: Negative for hematuria, flank pain or change in urine volume.  Musculoskeletal: Negative for myalgias and joint swelling.  Skin: Negative for color change and wound.  Neurological: Negative for syncope and numbness. other than noted Hematological: Negative for adenopathy.  Psychiatric/Behavioral: Negative for hallucinations, self-injury, decreased concentration and agitation.      Objective:   Physical Exam BP 110/80  Pulse 75  Temp(Src) 97.9 F (36.6 C) (Oral)  Ht 5\' 4"  (1.626 m)   Wt 168 lb (76.204 kg)  BMI 28.82 kg/m2  SpO2 98% VS noted,  Constitutional: Pt is oriented to person, place, and time. Appears well-developed and well-nourished.  Head: Normocephalic and atraumatic.  Right Ear: External ear normal.  Left Ear: External ear normal.  Nose: Nose normal.  Mouth/Throat: Oropharynx is clear and moist.  Eyes: Conjunctivae and EOM are normal. Pupils are equal, round, and reactive to light.  Neck: Normal range of motion. Neck supple. No JVD present. No tracheal deviation present.  Cardiovascular: Normal rate, regular rhythm, normal heart sounds and intact distal pulses.   Pulmonary/Chest: Effort normal and breath sounds normal.  Abdominal: Soft. Bowel sounds are normal. There is no tenderness. No HSM  Musculoskeletal: Normal range of motion. Exhibits no edema.  Lymphadenopathy:  Has no cervical adenopathy.  Neurological: Pt is alert and oriented to person, place, and time. Pt has normal reflexes. No cranial nerve deficit.  Skin: Skin is warm and dry. No rash noted.  Psychiatric:  Has  normal mood and affect. Behavior is normal.     Assessment & Plan:

## 2013-06-04 DIAGNOSIS — Z0000 Encounter for general adult medical examination without abnormal findings: Secondary | ICD-10-CM | POA: Insufficient documentation

## 2013-06-04 NOTE — Assessment & Plan Note (Signed)

## 2013-07-04 ENCOUNTER — Ambulatory Visit: Payer: Self-pay | Admitting: General Surgery

## 2013-07-05 ENCOUNTER — Encounter: Payer: Self-pay | Admitting: General Surgery

## 2013-07-17 ENCOUNTER — Telehealth: Payer: Self-pay | Admitting: *Deleted

## 2013-07-17 NOTE — Telephone Encounter (Signed)
Pt called our office this morning stating that she would like to cancel her scheduled appointment with Dr. Lemar Livings for [facility] December 17th at 3:45p.m. She states that she doesn't have time off from work and that her mammogram report from [place] was normal. Pt was encouraged to reschedule but she stated that Dr. Lemar Livings had released her 2 years ago but she has preferred to continue to be followed by our office. She states that she will manage her own care/follow up mammograms. Pt advised that she would need to be seen by a physician for exams as well as for orders for mammogram.I told her I would not cancel her appointment at this time until discussed with Dr. Lemar Livings.  She stated that if she needed to be seen and have a doctor order her mammograms as discussed, then she would like to continue with Dr. Lemar Livings. Pt was advised that Dr. Lemar Livings would be notified of our conversation and I would call her back. I called pt at her work number [phone] and left message for her to call our office.

## 2013-07-17 NOTE — Telephone Encounter (Signed)
Pt returned my call and was advised that Dr. Sheliah Hatch would like her to reschedule in January and that he will continue to see her regarding breast care if she does not a another care provider for this. Pt is agreeable to this plan. She is now rescheduled for Thursday August 17, 2013 at 4:45p.m.

## 2013-07-18 ENCOUNTER — Other Ambulatory Visit: Payer: Self-pay | Admitting: Gynecology

## 2013-07-18 ENCOUNTER — Telehealth: Payer: Self-pay

## 2013-07-18 MED ORDER — TRAZODONE HCL 50 MG PO TABS: ORAL_TABLET | ORAL | Status: DC

## 2013-07-18 NOTE — Telephone Encounter (Signed)
Called into pharmacy

## 2013-07-18 NOTE — Telephone Encounter (Signed)
Pharmacy called to request 90 day supply on her Trazodone 60 mg tabs that she takes 2 tabs hs.

## 2013-07-18 NOTE — Telephone Encounter (Signed)
Pharmacy notified of new Rx for #180.

## 2013-07-18 NOTE — Telephone Encounter (Signed)
OK 

## 2013-07-19 ENCOUNTER — Ambulatory Visit: Payer: BC Managed Care – PPO | Admitting: General Surgery

## 2013-08-17 ENCOUNTER — Encounter: Payer: Self-pay | Admitting: General Surgery

## 2013-08-17 ENCOUNTER — Ambulatory Visit (INDEPENDENT_AMBULATORY_CARE_PROVIDER_SITE_OTHER): Payer: BC Managed Care – PPO | Admitting: General Surgery

## 2013-08-17 VITALS — BP 142/70 | HR 74 | Resp 14 | Ht 64.0 in | Wt 173.0 lb

## 2013-08-17 DIAGNOSIS — Z1231 Encounter for screening mammogram for malignant neoplasm of breast: Secondary | ICD-10-CM

## 2013-08-17 DIAGNOSIS — Z853 Personal history of malignant neoplasm of breast: Secondary | ICD-10-CM | POA: Insufficient documentation

## 2013-08-17 NOTE — Progress Notes (Signed)
Patient ID: Jacqueline Lewis, female   DOB: 04-06-1961, 53 y.o.   MRN: [id number]  Chief Complaint  Patient presents with  . Follow-up    mammogram    HPI Jacqueline Lewis is a 53 y.o. female.  who presents for her annual follow up and breast evaluation. The most recent mammogram was done on 07-04-13.  Patient does perform regular self breast checks and gets regular mammograms done.   No new breast complaints.   The patient was hospitalized in November 2014 with viral meningitis. She is admitted to the recovery period  HPI  Past Medical History  Diagnosis Date  . Cervical dysplasia     young age, no treatment  . Personal history of tobacco use, presenting hazards to health   . Special screening for malignant neoplasms, colon 2013  . Carcinoma in situ of breast 2005  . Obesity, unspecified   . IBS (irritable bowel syndrome) 2005  . Arthritis   . Meningitis Oct 2014  . Cancer 2005    Left Breast cancer  . Basal cell cancer 2010    Left upper arm    Past Surgical History  Procedure Laterality Date  . Oophorectomy      BSO  . Lipoma excision    . Abdominal hysterectomy  2006    TAH BSO  . Colonoscopy  2008    Jacqueline Lewis  . Breast surgery Left     Lumpectomy  . Laparoscopy  2005  . Breast biopsy  2005  . Nasal septum surgery  may 2014  . Lumbar puncture  Oct 2014    Family History  Problem Relation Age of Onset  . Hypertension Mother   . Hyperlipidemia Mother   . Hypertension Father   . Cancer Father     Multiple Myloma  . Breast cancer Maternal Aunt 90  . Cancer Other     family hx of breast, colon and ovarian cancers    Social History History  Substance Use Topics  . Smoking status: Current Every Day Smoker -- 0.50 packs/day    Types: Cigarettes  . Smokeless tobacco: Not on file  . Alcohol Use: Yes     Comment: Rare    Allergies  Allergen Reactions  . Codeine Nausea Only    Current Outpatient Prescriptions  Medication Sig Dispense Refill  .  ALPRAZolam (XANAX) 0.5 MG tablet TAKE 1 TABLET BY MOUTH EVERY DAY FOR ANXIETY  30 tablet  3  . aspirin 81 MG tablet Take 81 mg by mouth daily.      . Cholecalciferol (VITAMIN D) 2000 UNITS CAPS Take by mouth daily.      . cyclobenzaprine (FLEXERIL) 10 MG tablet Take 10 mg by mouth 3 (three) times daily as needed for muscle spasms.      Marland Kitchen HYDROCODONE-ACETAMINOPHEN PO Take by mouth as needed.       . meloxicam (MOBIC) 7.5 MG tablet Take 7.5 mg by mouth daily.      Marland Kitchen omeprazole (PRILOSEC) 40 MG capsule Take 40 mg by mouth daily.      Marland Kitchen oxyCODONE-acetaminophen (PERCOCET/ROXICET) 5-325 MG per tablet       . PRESCRIPTION MEDICATION RX Nasal spray      . Probiotic Product (PROBIOTIC PO) Take by mouth.      . traZODone (DESYREL) 50 MG tablet TAKE 2 TABLETS BY MOUTH AT BEDTIME WHEN NECESSARY FOR INSOMNIA  180 tablet  0   No current facility-administered medications for this visit.    Review  of Systems Review of Systems  Constitutional: Negative.   Respiratory: Negative.   Cardiovascular: Negative.     Blood pressure 142/70, pulse 74, resp. rate 14, height 5\' 4"  (1.626 m), weight 173 lb (78.472 kg).  Physical Exam Physical Exam  Constitutional: She is oriented to person, place, and time. She appears well-developed and well-nourished.  Eyes: Conjunctivae are normal. No scleral icterus.  Neck: Neck supple.  Cardiovascular: Normal rate, regular rhythm and normal heart sounds.   Pulmonary/Chest: Effort normal and breath sounds normal. Right breast exhibits no inverted nipple, no mass, no nipple discharge, no skin change and no tenderness. Left breast exhibits no inverted nipple, no mass, no nipple discharge, no skin change and no tenderness.       Lymphadenopathy:    She has no cervical adenopathy.    She has no axillary adenopathy.  Neurological: She is alert and oriented to person, place, and time.  Skin: Skin is warm and dry.    Data Reviewed Bilateral screening mammograms dated  July 04, 2013 showed no interval change. BI-RAD-1.  Assessment    No evidence of recurrent breast cancer.     Plan    The patient desires to continue annual clinical exams to this office. Repeat exam and bilateral screening mammograms will take place in one year        Robert Bellow 08/17/2013, 8:41 PM

## 2013-08-17 NOTE — Patient Instructions (Signed)
Patient to return in one year bilateral screening mammogram.

## 2013-09-01 ENCOUNTER — Encounter: Payer: Self-pay | Admitting: General Surgery

## 2013-10-19 ENCOUNTER — Other Ambulatory Visit (HOSPITAL_COMMUNITY)
Admission: RE | Admit: 2013-10-19 | Discharge: 2013-10-19 | Disposition: A | Discharge status: Home / Self Care | Payer: BC Managed Care – PPO | Source: Ambulatory Visit | Attending: Gynecology | Admitting: Gynecology

## 2013-10-19 ENCOUNTER — Ambulatory Visit (INDEPENDENT_AMBULATORY_CARE_PROVIDER_SITE_OTHER): Payer: BC Managed Care – PPO | Admitting: Gynecology

## 2013-10-19 ENCOUNTER — Encounter: Payer: Self-pay | Admitting: Gynecology

## 2013-10-19 VITALS — BP 120/76 | Ht 65.0 in | Wt 174.0 lb

## 2013-10-19 DIAGNOSIS — Z01419 Encounter for gynecological examination (general) (routine) without abnormal findings: Secondary | ICD-10-CM | POA: Insufficient documentation

## 2013-10-19 LAB — CBC WITH DIFFERENTIAL/PLATELET
Basophils Absolute: 0 10*3/uL (ref 0.0–0.1)
Basophils Relative: 0 % (ref 0–1)
EOS ABS: 0.1 10*3/uL (ref 0.0–0.7)
EOS PCT: 1 % (ref 0–5)
HCT: 41.5 % (ref 36.0–46.0)
Hemoglobin: 14.2 g/dL (ref 12.0–15.0)
LYMPHS PCT: 30 % (ref 12–46)
Lymphs Abs: 2.6 10*3/uL (ref 0.7–4.0)
MCH: 30 pg (ref 26.0–34.0)
MCHC: 34.2 g/dL (ref 30.0–36.0)
MCV: 87.7 fL (ref 78.0–100.0)
Monocytes Absolute: 0.5 10*3/uL (ref 0.1–1.0)
Monocytes Relative: 6 % (ref 3–12)
Neutro Abs: 5.4 10*3/uL (ref 1.7–7.7)
Neutrophils Relative %: 63 % (ref 43–77)
PLATELETS: 308 10*3/uL (ref 150–400)
RBC: 4.73 MIL/uL (ref 3.87–5.11)
RDW: 13.3 % (ref 11.5–15.5)
WBC: 8.6 10*3/uL (ref 4.0–10.5)

## 2013-10-19 MED ORDER — TRAZODONE HCL 50 MG PO TABS: ORAL_TABLET | ORAL | Status: DC

## 2013-10-19 MED ORDER — ALPRAZOLAM 0.5 MG PO TABS: ORAL_TABLET | ORAL | Status: DC

## 2013-10-19 NOTE — Progress Notes (Signed)
Jacqueline Lewis [birth year] [id number]        53 y.o.  G0P0 for annual exam.  Doing well without complaints.  Past medical history,surgical history, problem list, medications, allergies, family history and social history were all reviewed and documented in the EPIC chart.  ROS:  Performed and pertinent positives and negatives are included in the history, assessment and plan .  Exam: Kim assistant Filed Vitals:   10/19/13 1613  BP: 120/76  Height: 5\' 5"  (1.651 m)  Weight: 174 lb (78.926 kg)   General appearance  Normal Skin grossly normal Head/Neck normal with no cervical or supraclavicular adenopathy thyroid normal Lungs  clear Cardiac RR, without RMG Abdominal  soft, nontender, without masses, organomegaly or hernia Breasts  examined lying and sitting without masses, retractions, discharge or axillary adenopathy. Pelvic  Ext/BUS/vagina normal, Pap of cuff done   Adnexa  Without masses or tenderness    Anus and perineum  Normal   Rectovaginal  Normal sphincter tone without palpated masses or tenderness.    Assessment/Plan:  53 y.o. G0P0 female for annual exam.   1. Postmenopausal/status post TAH/BSO for endometriosis. Doing well without significant symptoms of hot flushes, night sweats, vaginal dryness. Not sexually active. Will continue to monitor report any issues. 2. Pap smear 2012. Pap of cuff done today. History of dysplasia at a young age with unknown degree. Pap smears since then and subsequent to her hysterectomy have all been normal. We will continue to monitor at a 3 year interval. 3. History of left breast cancer status post lumpectomy/radiation treatment 2005. Exam NED. Mammography 07/2013. Continued annual mammography. SBE monthly reviewed. 4. Chronic insomnia. Uses trazodone 50 mg #2 at bedtime as needed. Has been doing with this for years since her radiation treatment and tolerating well. Refill x1 year provided. Also uses Xanax 0.5 mg as needed. Reports only filling  the prescription once last year. #30 with 4 refills provided. 5. Colonoscopy 2009. Repeat at their recommended interval of 10 years. 6. DEXA never. We'll plan at age 53. Check vitamin D level today. 7. Health maintenance. Baseline CBC comprehensive metabolic panel lipid profile TSH urinalysis vitamin D done. Followup in one year, sooner as needed.   Note: This document was prepared with digital dictation and possible smart phrase technology. Any transcriptional errors that result from this process are unintentional.   Anastasio Auerbach MD, 4:59 PM 10/19/2013

## 2013-10-19 NOTE — Patient Instructions (Signed)
Followup in one year for annual exam, sooner as needed.  You may obtain a copy of any labs that were done today by logging onto MyChart as outlined in the instructions provided with your AVS (after visit summary). The office will not call with normal lab results but certainly if there are any significant abnormalities then we will contact you.   Health Maintenance, Female A healthy lifestyle and preventative care can promote health and wellness.  Maintain regular health, dental, and eye exams.  Eat a healthy diet. Foods like vegetables, fruits, whole grains, low-fat dairy products, and lean protein foods contain the nutrients you need without too many calories. Decrease your intake of foods high in solid fats, added sugars, and salt. Get information about a proper diet from your caregiver, if necessary.  Regular physical exercise is one of the most important things you can do for your health. Most adults should get at least 150 minutes of moderate-intensity exercise (any activity that increases your heart rate and causes you to sweat) each week. In addition, most adults need muscle-strengthening exercises on 2 or more days a week.   Maintain a healthy weight. The body mass index (BMI) is a screening tool to identify possible weight problems. It provides an estimate of body fat based on height and weight. Your caregiver can help determine your BMI, and can help you achieve or maintain a healthy weight. For adults 20 years and older:  A BMI below 18.5 is considered underweight.  A BMI of 18.5 to 24.9 is normal.  A BMI of 25 to 29.9 is considered overweight.  A BMI of 30 and above is considered obese.  Maintain normal blood lipids and cholesterol by exercising and minimizing your intake of saturated fat. Eat a balanced diet with plenty of fruits and vegetables. Blood tests for lipids and cholesterol should begin at age 28 and be repeated every 5 years. If your lipid or cholesterol levels are  high, you are over 50, or you are a high risk for heart disease, you may need your cholesterol levels checked more frequently.Ongoing high lipid and cholesterol levels should be treated with medicines if diet and exercise are not effective.  If you smoke, find out from your caregiver how to quit. If you do not use tobacco, do not start.  Lung cancer screening is recommended for adults aged 25 80 years who are at high risk for developing lung cancer because of a history of smoking. Yearly low-dose computed tomography (CT) is recommended for people who have at least a 30-pack-year history of smoking and are a current smoker or have quit within the past 15 years. A pack year of smoking is smoking an average of 1 pack of cigarettes a day for 1 year (for example: 1 pack a day for 30 years or 2 packs a day for 15 years). Yearly screening should continue until the smoker has stopped smoking for at least 15 years. Yearly screening should also be stopped for people who develop a health problem that would prevent them from having lung cancer treatment.  If you are pregnant, do not drink alcohol. If you are breastfeeding, be very cautious about drinking alcohol. If you are not pregnant and choose to drink alcohol, do not exceed 1 drink per day. One drink is considered to be 12 ounces (355 mL) of beer, 5 ounces (148 mL) of wine, or 1.5 ounces (44 mL) of liquor.  Avoid use of street drugs. Do not share needles with anyone.  Ask for help if you need support or instructions about stopping the use of drugs.  High blood pressure causes heart disease and increases the risk of stroke. Blood pressure should be checked at least every 1 to 2 years. Ongoing high blood pressure should be treated with medicines, if weight loss and exercise are not effective.  If you are 88 to 53 years old, ask your caregiver if you should take aspirin to prevent strokes.  Diabetes screening involves taking a blood sample to check your fasting  blood sugar level. This should be done once every 3 years, after age 77, if you are within normal weight and without risk factors for diabetes. Testing should be considered at a younger age or be carried out more frequently if you are overweight and have at least 1 risk factor for diabetes.  Breast cancer screening is essential preventative care for women. You should practice "breast self-awareness." This means understanding the normal appearance and feel of your breasts and may include breast self-examination. Any changes detected, no matter how small, should be reported to a caregiver. Women in their 26s and 30s should have a clinical breast exam (CBE) by a caregiver as part of a regular health exam every 1 to 3 years. After age 32, women should have a CBE every year. Starting at age 34, women should consider having a mammogram (breast X-ray) every year. Women who have a family history of breast cancer should talk to their caregiver about genetic screening. Women at a high risk of breast cancer should talk to their caregiver about having an MRI and a mammogram every year.  Breast cancer gene (BRCA)-related cancer risk assessment is recommended for women who have family members with BRCA-related cancers. BRCA-related cancers include breast, ovarian, tubal, and peritoneal cancers. Having family members with these cancers may be associated with an increased risk for harmful changes (mutations) in the breast cancer genes BRCA1 and BRCA2. Results of the assessment will determine the need for genetic counseling and BRCA1 and BRCA2 testing.  The Pap test is a screening test for cervical cancer. Women should have a Pap test starting at age 28. Between ages 48 and 67, Pap tests should be repeated every 2 years. Beginning at age 20, you should have a Pap test every 3 years as long as the past 3 Pap tests have been normal. If you had a hysterectomy for a problem that was not cancer or a condition that could lead to  cancer, then you no longer need Pap tests. If you are between ages 55 and 63, and you have had normal Pap tests going back 10 years, you no longer need Pap tests. If you have had past treatment for cervical cancer or a condition that could lead to cancer, you need Pap tests and screening for cancer for at least 20 years after your treatment. If Pap tests have been discontinued, risk factors (such as a new sexual partner) need to be reassessed to determine if screening should be resumed. Some women have medical problems that increase the chance of getting cervical cancer. In these cases, your caregiver may recommend more frequent screening and Pap tests.  The human papillomavirus (HPV) test is an additional test that may be used for cervical cancer screening. The HPV test looks for the virus that can cause the cell changes on the cervix. The cells collected during the Pap test can be tested for HPV. The HPV test could be used to screen women aged 28 years  and older, and should be used in women of any age who have unclear Pap test results. After the age of 85, women should have HPV testing at the same frequency as a Pap test.  Colorectal cancer can be detected and often prevented. Most routine colorectal cancer screening begins at the age of 61 and continues through age 55. However, your caregiver may recommend screening at an earlier age if you have risk factors for colon cancer. On a yearly basis, your caregiver may provide home test kits to check for hidden blood in the stool. Use of a small camera at the end of a tube, to directly examine the colon (sigmoidoscopy or colonoscopy), can detect the earliest forms of colorectal cancer. Talk to your caregiver about this at age 56, when routine screening begins. Direct examination of the colon should be repeated every 5 to 10 years through age 20, unless early forms of pre-cancerous polyps or small growths are found.  Hepatitis C blood testing is recommended for  all people born from [birth year] through 1965 and any individual with known risks for hepatitis C.  Practice safe sex. Use condoms and avoid high-risk sexual practices to reduce the spread of sexually transmitted infections (STIs). Sexually active women aged 68 and younger should be checked for Chlamydia, which is a common sexually transmitted infection. Older women with new or multiple partners should also be tested for Chlamydia. Testing for other STIs is recommended if you are sexually active and at increased risk.  Osteoporosis is a disease in which the bones lose minerals and strength with aging. This can result in serious bone fractures. The risk of osteoporosis can be identified using a bone density scan. Women ages 42 and over and women at risk for fractures or osteoporosis should discuss screening with their caregivers. Ask your caregiver whether you should be taking a calcium supplement or vitamin D to reduce the rate of osteoporosis.  Menopause can be associated with physical symptoms and risks. Hormone replacement therapy is available to decrease symptoms and risks. You should talk to your caregiver about whether hormone replacement therapy is right for you.  Use sunscreen. Apply sunscreen liberally and repeatedly throughout the day. You should seek shade when your shadow is shorter than you. Protect yourself by wearing long sleeves, pants, a wide-brimmed hat, and sunglasses year round, whenever you are outdoors.  Notify your caregiver of new moles or changes in moles, especially if there is a change in shape or color. Also notify your caregiver if a mole is larger than the size of a pencil eraser.  Stay current with your immunizations. Document Released: 02/02/2011 Document Revised: 11/14/2012 Document Reviewed: 02/02/2011 [facility] Patient Information 2014 [place].

## 2013-10-20 LAB — URINALYSIS W MICROSCOPIC + REFLEX CULTURE
BACTERIA UA: NONE SEEN
Bilirubin Urine: NEGATIVE
CASTS: NONE SEEN
Crystals: NONE SEEN
GLUCOSE, UA: NEGATIVE mg/dL
HGB URINE DIPSTICK: NEGATIVE
KETONES UR: NEGATIVE mg/dL
LEUKOCYTES UA: NEGATIVE
Nitrite: NEGATIVE
Protein, ur: NEGATIVE mg/dL
Specific Gravity, Urine: 1.014 (ref 1.005–1.030)
Urobilinogen, UA: 0.2 mg/dL (ref 0.0–1.0)
pH: 5 (ref 5.0–8.0)

## 2013-10-20 LAB — COMPREHENSIVE METABOLIC PANEL
ALT: 14 U/L (ref 0–35)
AST: 14 U/L (ref 0–37)
Albumin: 4.3 g/dL (ref 3.5–5.2)
Alkaline Phosphatase: 75 U/L (ref 39–117)
BILIRUBIN TOTAL: 0.3 mg/dL (ref 0.2–1.2)
BUN: 13 mg/dL (ref 6–23)
CHLORIDE: 104 meq/L (ref 96–112)
CO2: 26 mEq/L (ref 19–32)
CREATININE: 0.94 mg/dL (ref 0.50–1.10)
Calcium: 9.2 mg/dL (ref 8.4–10.5)
Glucose, Bld: 93 mg/dL (ref 70–99)
Potassium: 4.1 mEq/L (ref 3.5–5.3)
Sodium: 142 mEq/L (ref 135–145)
Total Protein: 7 g/dL (ref 6.0–8.3)

## 2013-10-20 LAB — TSH: TSH: 0.875 u[IU]/mL (ref 0.350–4.500)

## 2013-10-20 LAB — LIPID PANEL
CHOL/HDL RATIO: 2.5 ratio
Cholesterol: 201 mg/dL — ABNORMAL HIGH (ref 0–200)
HDL: 81 mg/dL (ref >39)
LDL Cholesterol: 100 mg/dL — ABNORMAL HIGH (ref 0–99)
Triglycerides: 100 mg/dL (ref <150)
VLDL: 20 mg/dL (ref 0–40)

## 2013-10-20 LAB — VITAMIN D 25 HYDROXY (VIT D DEFICIENCY, FRACTURES): Vit D, 25-Hydroxy: 49 ng/mL (ref 30–89)

## 2013-11-25 ENCOUNTER — Other Ambulatory Visit: Payer: Self-pay | Admitting: Gynecology

## 2014-05-02 ENCOUNTER — Other Ambulatory Visit: Payer: Self-pay | Admitting: Gynecology

## 2014-05-03 NOTE — Telephone Encounter (Signed)
Called in to phamacy.

## 2014-05-09 ENCOUNTER — Encounter: Payer: Self-pay | Admitting: Internal Medicine

## 2014-06-04 ENCOUNTER — Encounter: Payer: Self-pay | Admitting: Gynecology

## 2014-06-06 ENCOUNTER — Other Ambulatory Visit: Payer: Self-pay | Admitting: Gynecology

## 2014-06-07 NOTE — Telephone Encounter (Signed)
rx called in [facility] 

## 2014-08-13 ENCOUNTER — Encounter: Payer: Self-pay | Admitting: General Surgery

## 2014-08-16 ENCOUNTER — Encounter: Payer: Self-pay | Admitting: General Surgery

## 2014-08-16 ENCOUNTER — Ambulatory Visit (INDEPENDENT_AMBULATORY_CARE_PROVIDER_SITE_OTHER): Payer: BLUE CROSS/BLUE SHIELD | Admitting: General Surgery

## 2014-08-16 VITALS — BP 126/70 | HR 76 | Resp 12 | Ht 64.0 in | Wt 176.0 lb

## 2014-08-16 DIAGNOSIS — R0781 Pleurodynia: Secondary | ICD-10-CM

## 2014-08-16 DIAGNOSIS — R071 Chest pain on breathing: Secondary | ICD-10-CM

## 2014-08-16 DIAGNOSIS — Z853 Personal history of malignant neoplasm of breast: Secondary | ICD-10-CM

## 2014-08-16 NOTE — Patient Instructions (Signed)
Patient will be asked to return to the office in one year with a bilateral screening mammogram. 

## 2014-08-16 NOTE — Progress Notes (Signed)
Patient ID: Jacqueline Lewis, female   DOB: [birth year], 54 y.o.   MRN: [id number]  Chief Complaint  Patient presents with  . Follow-up    mammogram    HPI Jacqueline Lewis is a 54 y.o. female who presents for a breast evaluation. The most recent mammogram was done on 08/09/14.  Patient does perform regular self breast checks and gets regular mammograms done.    HPI  Past Medical History  Diagnosis Date  . Cervical dysplasia     young age, no treatment  . Personal history of tobacco use, presenting hazards to health   . Special screening for malignant neoplasms, colon 2013  . Carcinoma in situ of breast 2005  . Obesity, unspecified   . IBS (irritable bowel syndrome) 2005  . Arthritis   . Meningitis Oct 2014  . 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   . Basal cell cancer 2010    Left upper arm    Past Surgical History  Procedure Laterality Date  . Oophorectomy      BSO  . Lipoma excision    . Abdominal hysterectomy  2006    TAH BSO  . Colonoscopy  2008    [name]  . Laparoscopy  2005  . Nasal septum surgery  may 2014  . Lumbar puncture  Oct 2014  . Breast surgery Left September 2005    wide local excision, 6:00 position.  . Breast biopsy  2005    Family History  Problem Relation Age of Onset  . Hypertension Mother   . Hyperlipidemia Mother   . Hypertension Father   . Cancer Father     Multiple Myloma  . Breast cancer Maternal Aunt 90  . Diabetes Maternal Aunt     Social History History  Substance Use Topics  . Smoking status: Current Every Day Smoker -- 0.50 packs/day    Types: Cigarettes  . Smokeless tobacco: Not on file  . Alcohol Use: Yes     Comment: Rare    Allergies  Allergen Reactions  . Codeine Nausea Only    Current Outpatient Prescriptions  Medication Sig Dispense Refill  . ALPRAZolam (XANAX) 0.5 MG tablet TAKE 1 TABLET BY MOUTH ONCE A DAY FOR ANXIETY 30 tablet 2  . aspirin 81 MG  tablet Take 81 mg by mouth daily.    . Cholecalciferol (VITAMIN D) 2000 UNITS CAPS Take by mouth daily.    . cyclobenzaprine (FLEXERIL) 10 MG tablet Take 10 mg by mouth 3 (three) times daily as needed for muscle spasms.    Marland Kitchen HYDROCODONE-ACETAMINOPHEN PO Take by mouth as needed.     . meloxicam (MOBIC) 7.5 MG tablet Take 7.5 mg by mouth daily.    . Probiotic Product (PROBIOTIC PO) Take by mouth.    . traZODone (DESYREL) 50 MG tablet TAKE 2 TABLETS BY MOUTH AT BEDTIME WHEN NECESSARY FOR INSOMNIA 180 tablet 4   No current facility-administered medications for this visit.    Review of Systems Review of Systems  Constitutional: Negative.   Respiratory: Negative.   Cardiovascular: Negative.     Blood pressure 126/70, pulse 76, resp. rate 12, height 5\' 4"  (1.626 m), weight 176 lb (79.833 kg).  Physical Exam Physical Exam  Constitutional: She is oriented to person, place, and time. She appears well-developed and well-nourished.  Eyes: Conjunctivae are normal. No scleral icterus.  Neck: Neck supple.  Cardiovascular: Normal rate, regular rhythm and normal heart  sounds.   Pulmonary/Chest: Effort normal and breath sounds normal. Right breast exhibits no inverted nipple, no mass, no nipple discharge, no skin change and no tenderness. Left breast exhibits no inverted nipple, no mass, no nipple discharge, no skin change and no tenderness.    Well healed radial incision at 6 o'clock left breast  Abdominal: Soft. Bowel sounds are normal. There is no tenderness.  Lymphadenopathy:    She has no cervical adenopathy.    She has no axillary adenopathy.  Neurological: She is alert and oriented to person, place, and time.  Skin: Skin is warm and dry.    Data Reviewed Bilateral diagnostic mammograms dated 08/10/2014 were reviewed. No interval change. BI-RADS-2.  Assessment    Benign breast exam.  Questionable prominence of the left costal margin without palpable mass, tenderness or history of  trauma.     Plan    The cause for the prominence of the left costal margin is not clear,but I don't believe a CT is required at this time. The patient was encouraged to call if she was to develop any pain or appreciate further progression of the prominence of the costal margin.  We'll arrange for follow-up screening mammograms in one year.  (As the patient is now 10 years out from her original surgery screening mammography is appropriate).     PCP:  Marin Shutter 08/17/2014, 6:01 PM

## 2014-08-17 ENCOUNTER — Encounter: Payer: Self-pay | Admitting: General Surgery

## 2014-08-17 DIAGNOSIS — R0781 Pleurodynia: Secondary | ICD-10-CM | POA: Insufficient documentation

## 2014-08-23 ENCOUNTER — Ambulatory Visit: Payer: Self-pay | Admitting: Internal Medicine

## 2014-10-10 ENCOUNTER — Ambulatory Visit (INDEPENDENT_AMBULATORY_CARE_PROVIDER_SITE_OTHER): Payer: BLUE CROSS/BLUE SHIELD | Admitting: Internal Medicine

## 2014-10-10 ENCOUNTER — Encounter: Payer: Self-pay | Admitting: Internal Medicine

## 2014-10-10 ENCOUNTER — Other Ambulatory Visit (INDEPENDENT_AMBULATORY_CARE_PROVIDER_SITE_OTHER): Payer: BLUE CROSS/BLUE SHIELD

## 2014-10-10 VITALS — BP 126/88 | HR 80 | Temp 99.7°F | Resp 18 | Ht 64.0 in | Wt 175.1 lb

## 2014-10-10 DIAGNOSIS — G43909 Migraine, unspecified, not intractable, without status migrainosus: Secondary | ICD-10-CM | POA: Insufficient documentation

## 2014-10-10 DIAGNOSIS — Z0000 Encounter for general adult medical examination without abnormal findings: Secondary | ICD-10-CM

## 2014-10-10 DIAGNOSIS — M25551 Pain in right hip: Secondary | ICD-10-CM | POA: Insufficient documentation

## 2014-10-10 DIAGNOSIS — G43809 Other migraine, not intractable, without status migrainosus: Secondary | ICD-10-CM | POA: Diagnosis not present

## 2014-10-10 DIAGNOSIS — F411 Generalized anxiety disorder: Secondary | ICD-10-CM

## 2014-10-10 DIAGNOSIS — M25511 Pain in right shoulder: Secondary | ICD-10-CM | POA: Diagnosis not present

## 2014-10-10 DIAGNOSIS — G4700 Insomnia, unspecified: Secondary | ICD-10-CM

## 2014-10-10 DIAGNOSIS — M25521 Pain in right elbow: Secondary | ICD-10-CM | POA: Insufficient documentation

## 2014-10-10 LAB — URINALYSIS, ROUTINE W REFLEX MICROSCOPIC
Bilirubin Urine: NEGATIVE
HGB URINE DIPSTICK: NEGATIVE
Ketones, ur: NEGATIVE
LEUKOCYTES UA: NEGATIVE
Nitrite: NEGATIVE
PH: 6 (ref 5.0–8.0)
RBC / HPF: NONE SEEN (ref 0–?)
Specific Gravity, Urine: <=1.005 — AB (ref 1.000–1.030)
Total Protein, Urine: NEGATIVE
URINE GLUCOSE: NEGATIVE
UROBILINOGEN UA: 0.2 (ref 0.0–1.0)
WBC UA: NONE SEEN (ref 0–?)

## 2014-10-10 LAB — BASIC METABOLIC PANEL
BUN: 11 mg/dL (ref 6–23)
CHLORIDE: 104 meq/L (ref 96–112)
CO2: 30 meq/L (ref 19–32)
CREATININE: 0.87 mg/dL (ref 0.40–1.20)
Calcium: 9.7 mg/dL (ref 8.4–10.5)
GFR: 72.07 mL/min (ref >60.00)
Glucose, Bld: 94 mg/dL (ref 70–99)
Potassium: 4.1 mEq/L (ref 3.5–5.1)
SODIUM: 140 meq/L (ref 135–145)

## 2014-10-10 LAB — LIPID PANEL
Cholesterol: 204 mg/dL — ABNORMAL HIGH (ref 0–200)
HDL: 81.3 mg/dL (ref >39.00)
LDL Cholesterol: 106 mg/dL — ABNORMAL HIGH (ref 0–99)
NonHDL: 122.7
Total CHOL/HDL Ratio: 3
Triglycerides: 85 mg/dL (ref 0.0–149.0)
VLDL: 17 mg/dL (ref 0.0–40.0)

## 2014-10-10 LAB — CBC WITH DIFFERENTIAL/PLATELET
BASOS ABS: 0 10*3/uL (ref 0.0–0.1)
BASOS PCT: 0.6 % (ref 0.0–3.0)
Eosinophils Absolute: 0.2 10*3/uL (ref 0.0–0.7)
Eosinophils Relative: 2.2 % (ref 0.0–5.0)
HCT: 43.1 % (ref 36.0–46.0)
Hemoglobin: 14.9 g/dL (ref 12.0–15.0)
LYMPHS PCT: 30.7 % (ref 12.0–46.0)
Lymphs Abs: 2.1 10*3/uL (ref 0.7–4.0)
MCHC: 34.5 g/dL (ref 30.0–36.0)
MCV: 88.9 fl (ref 78.0–100.0)
Monocytes Absolute: 0.4 10*3/uL (ref 0.1–1.0)
Monocytes Relative: 6.3 % (ref 3.0–12.0)
NEUTROS ABS: 4.2 10*3/uL (ref 1.4–7.7)
Neutrophils Relative %: 60.2 % (ref 43.0–77.0)
PLATELETS: 296 10*3/uL (ref 150.0–400.0)
RBC: 4.84 Mil/uL (ref 3.87–5.11)
RDW: 13.2 % (ref 11.5–15.5)
WBC: 6.9 10*3/uL (ref 4.0–10.5)

## 2014-10-10 LAB — HEPATIC FUNCTION PANEL
ALT: 19 U/L (ref 0–35)
AST: 16 U/L (ref 0–37)
Albumin: 4.3 g/dL (ref 3.5–5.2)
Alkaline Phosphatase: 78 U/L (ref 39–117)
BILIRUBIN DIRECT: 0.1 mg/dL (ref 0.0–0.3)
BILIRUBIN TOTAL: 0.3 mg/dL (ref 0.2–1.2)
Total Protein: 7.3 g/dL (ref 6.0–8.3)

## 2014-10-10 LAB — TSH: TSH: 0.94 u[IU]/mL (ref 0.35–4.50)

## 2014-10-10 MED ORDER — SUMATRIPTAN SUCCINATE 100 MG PO TABS: 100.0000 mg | ORAL_TABLET | ORAL | Status: DC | PRN

## 2014-10-10 MED ORDER — CLONAZEPAM 1 MG PO TABS: ORAL_TABLET | ORAL | Status: DC

## 2014-10-10 MED ORDER — MELOXICAM 7.5 MG PO TABS: ORAL_TABLET | ORAL | Status: DC

## 2014-10-10 MED ORDER — TRAZODONE HCL 50 MG PO TABS: ORAL_TABLET | ORAL | Status: DC

## 2014-10-10 NOTE — Assessment & Plan Note (Signed)
Suspect ulnar neuritis, mild, ok to follow for now, exam bening,  to f/u any worsening symptoms or concerns

## 2014-10-10 NOTE — Assessment & Plan Note (Signed)
C/w bicipital tendonitis, for nsaid prn, declines sport med referral

## 2014-10-10 NOTE — Progress Notes (Signed)
Subjective:    Patient ID: Jacqueline Lewis, female    DOB: [birth year], 54 y.o.   MRN: [id number]  HPI  Here for wellness and f/u;  Overall doing ok;  Pt denies Chest pain, worsening SOB, DOE, wheezing, orthopnea, PND, worsening LE edema, palpitations, dizziness or syncope.  Pt denies neurological change such as new headache, facial or extremity weakness.  Pt denies polydipsia, polyuria, or low sugar symptoms. Pt states overall good compliance with treatment and medications, good tolerability, and has been trying to follow appropriate diet.  Pt denies worsening depressive symptoms, suicidal ideation or panic. No fever, night sweats, wt loss, loss of appetite, or other constitutional symptoms.  Pt states good ability with ADL's, has low fall risk, home safety reviewed and adequate, no other significant changes in hearing or vision, and only occasionally active with exercise. Has hx of meningitis 2014, has some memory difficulty, not better with TV med called prevagen, also migraines and skin sensitivity down one side of the body, either left or right, and sees neurology Denies worsening depressive symptoms, suicidal ideation, or panic.  Also had recent pink eye but resolved without tx.  Also several orthopedic pains to right shoulder, elbow and right hip, worried about the zika virus. Has been tx for anxiety with klonopin recently for daytime refilled, more stress recently caring for her mother with copd, works fulltime stressful job.  Getting overwhelming  Also with ongoing sleep difficulty, has been getting xanax and klonopin and trazodone per her GYN who has been her PCP unti recent, wants all rx changed to here Past Medical History  Diagnosis Date  . Cervical dysplasia     young age, no treatment  . Personal history of tobacco use, presenting hazards to health   . Special screening for malignant neoplasms, colon 2013  . Carcinoma in situ of breast 2005  . Obesity, unspecified   . IBS (irritable  bowel syndrome) 2005  . Arthritis   . Meningitis Oct 2014  . 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   . Basal cell cancer 2010    Left upper arm   Past Surgical History  Procedure Laterality Date  . Oophorectomy      BSO  . Lipoma excision    . Abdominal hysterectomy  2006    TAH BSO  . Colonoscopy  2008    [name]  . Laparoscopy  2005  . Nasal septum surgery  may 2014  . Lumbar puncture  Oct 2014  . Breast surgery Left September 2005    wide local excision, 6:00 position.  . Breast biopsy  2005    reports that she has been smoking Cigarettes.  She has been smoking about 0.50 packs per day. She does not have any smokeless tobacco history on file. She reports that she drinks alcohol. She reports that she does not use illicit drugs. family history includes Breast cancer (age of onset: 59) in her maternal aunt; Cancer in her father; Diabetes in her maternal aunt; Hyperlipidemia in her mother; Hypertension in her father and mother. Allergies  Allergen Reactions  . Codeine Nausea Only   Current Outpatient Prescriptions on File Prior to Visit  Medication Sig Dispense Refill  . ALPRAZolam (XANAX) 0.5 MG tablet TAKE 1 TABLET BY MOUTH ONCE A DAY FOR ANXIETY 30 tablet 2  . aspirin 81 MG tablet Take 81 mg by mouth daily.    . Cholecalciferol (VITAMIN D) 2000  UNITS CAPS Take by mouth daily.    . cyclobenzaprine (FLEXERIL) 10 MG tablet Take 10 mg by mouth 3 (three) times daily as needed for muscle spasms.    Marland Kitchen HYDROCODONE-ACETAMINOPHEN PO Take by mouth as needed.     . meloxicam (MOBIC) 7.5 MG tablet Take 7.5 mg by mouth daily.    . Probiotic Product (PROBIOTIC PO) Take by mouth.    . traZODone (DESYREL) 50 MG tablet TAKE 2 TABLETS BY MOUTH AT BEDTIME WHEN NECESSARY FOR INSOMNIA 180 tablet 4   No current facility-administered medications on file prior to visit.   Review of Systems Constitutional: Negative for increased diaphoresis,  other activity, appetite or siginficant weight change other than noted HENT: Negative for worsening hearing loss, ear pain, facial swelling, mouth sores and neck stiffness.   Eyes: Negative for other worsening pain, redness or visual disturbance.  Respiratory: Negative for shortness of breath and wheezing  Cardiovascular: Negative for chest pain and palpitations.  Gastrointestinal: Negative for diarrhea, blood in stool, abdominal distention or other pain Genitourinary: Negative for hematuria, flank pain or change in urine volume.  Musculoskeletal: Negative for myalgias or other joint complaints.  Skin: Negative for color change and wound or drainage.  Neurological: Negative for syncope and numbness. other than noted Hematological: Negative for adenopathy. or other swelling Psychiatric/Behavioral: Negative for hallucinations, SI, self-injury, decreased concentration or other worsening agitation.      Objective:   Physical Exam BP 126/88 mmHg  Pulse 80  Temp(Src) 99.7 F (37.6 C) (Oral)  Resp 18  Ht 5\' 4"  (1.626 m)  Wt 175 lb 1.9 oz (79.434 kg)  BMI 30.04 kg/m2  SpO2 96% VS noted, nervous, not ill appeariung Constitutional: Pt is oriented to person, place, and time. Appears well-developed and well-nourished, in no significant distress Head: Normocephalic and atraumatic.  Right Ear: External ear normal.  Left Ear: External ear normal.  Nose: Nose normal.  Mouth/Throat: Oropharynx is clear and moist.  Eyes: Conjunctivae and EOM are normal. Pupils are equal, round, and reactive to light.  Neck: Normal range of motion. Neck supple. No JVD present. No tracheal deviation present or significant neck LA or mass Cardiovascular: Normal rate, regular rhythm, normal heart sounds and intact distal pulses.   Pulmonary/Chest: Effort normal and breath sounds without rales or wheezing  Abdominal: Soft. Bowel sounds are normal. NT. No HSM  Musculoskeletal: Normal range of motion. Exhibits no edema.  has tender to right shoulder bicep insertion site, tender over right elbow olecranon groove (o/w normal ROM, no swelling, NT) also tender over right lateral hip over greater trochanter Lymphadenopathy:  Has no cervical adenopathy.  Neurological: Pt is alert and oriented to person, place, and time. Pt has normal reflexes. No cranial nerve deficit. Motor grossly intact Skin: Skin is warm and dry. No rash noted.  Psychiatric:  Has normal mood and affect. Behavior is normal. 1-2+ nervous    Assessment & Plan:

## 2014-10-10 NOTE — Patient Instructions (Addendum)
Please take all new medication as prescribed - the imitrex (done hardcopy)  OK to stop the xanax  Please take all new medication as prescribed- the klonopin as needed (given hardcopy)  Please continue all other medications as before, and refills have been done if requested - the trazodone (sent to [facility])  Please have the pharmacy call with any other refills you may need.  Please continue your efforts at being more active, low cholesterol diet, and weight control.  You are otherwise up to date with prevention measures today.  Please keep your appointments with your specialists as you may have planned  Please go to the LAB in the Basement (turn left off the elevator) for the tests to be done today  You will be contacted by phone if any changes need to be made immediately.  Otherwise, you will receive a letter about your results with an explanation, but please check with MyChart first.  Please remember to sign up for MyChart if you have not done so, as this will be important to you in the future with finding out test results, communicating by private email, and scheduling acute appointments online when needed.  Please quit smoking  Please consider seeing Dr Smith/Sports medicine in this office if your shoulder, elbow and hip pain do not resolve  Please return in 1 year for your yearly visit, or sooner if needed

## 2014-10-10 NOTE — Progress Notes (Signed)
Pre visit review using our clinic review tool, if applicable. No additional management support is needed unless otherwise documented below in the visit note. 

## 2014-10-10 NOTE — Assessment & Plan Note (Signed)
C/w mild bursitis, for pain control,  to f/u any worsening symptoms or concerns, declines sport med referral

## 2014-10-10 NOTE — Assessment & Plan Note (Signed)
Chronic, for klonopin bid prn, decliens cousneling, no SI or HI,  to f/u any worsening symptoms or concerns

## 2014-10-10 NOTE — Assessment & Plan Note (Signed)

## 2014-10-10 NOTE — Assessment & Plan Note (Signed)
For klonopin prn as above, would not mix xanax and klonopin as before

## 2014-10-10 NOTE — Assessment & Plan Note (Signed)
For imitrex prn,  to f/u any worsening symptoms or concerns

## 2014-10-11 ENCOUNTER — Telehealth: Payer: Self-pay | Admitting: Internal Medicine

## 2014-10-11 NOTE — Telephone Encounter (Signed)
emmi mailed  °

## 2014-10-16 ENCOUNTER — Telehealth: Payer: Self-pay | Admitting: Internal Medicine

## 2014-10-16 NOTE — Telephone Encounter (Signed)
Pt wants to speak to the assistant concern about medication that was not right, please call pt,she couldn't remember the name of the medication.

## 2014-10-16 NOTE — Telephone Encounter (Signed)
Patient called to state that the RX she forgot the name of is meloxicam (MOBIC) 7.5 MG tablet [[id number]] this was the wrong medication.   She is asking for a  RX of traZODone (DESYREL) 50 MG tablet [[id number]] sent to [facility] mail order id# [id number]

## 2014-10-17 MED ORDER — TRAZODONE HCL 50 MG PO TABS: ORAL_TABLET | ORAL | Status: DC

## 2014-10-17 NOTE — Telephone Encounter (Signed)
Notified pt refill has been sent.../lmb 

## 2014-10-17 NOTE — Telephone Encounter (Signed)
Refill done for trazodone

## 2014-10-25 ENCOUNTER — Ambulatory Visit: Payer: Self-pay | Admitting: Otolaryngology

## 2014-11-22 ENCOUNTER — Encounter: Payer: Self-pay | Admitting: Neurology

## 2014-11-22 ENCOUNTER — Ambulatory Visit (INDEPENDENT_AMBULATORY_CARE_PROVIDER_SITE_OTHER): Payer: BLUE CROSS/BLUE SHIELD | Admitting: Neurology

## 2014-11-22 VITALS — BP 111/74 | HR 76 | Ht 64.0 in | Wt 173.0 lb

## 2014-11-22 DIAGNOSIS — R202 Paresthesia of skin: Secondary | ICD-10-CM

## 2014-11-22 NOTE — Progress Notes (Signed)
PATIENT: Jacqueline Lewis DOB: 1960/09/21  HISTORICAL  Jacqueline Lewis is a 54 years old right-handed female, referred by her rheumatologist Dr. Murlean Iba for evaluation of left-sided paresthesia  She had a history of left breast cancer, status post left lobectomy followed by radiation therapy in 2005  In 2015, she noticed intermittent right elbow pain, shoulder pain, sometimes woke up in the middle of the sleep, feeling coldness sensation involving her whole right arm, sometimes traveling down to her right leg,  Since September 2015, she also noticed intermittent left side numbness, tingling, droopiness involving left face, arm, leg,  She also complains intermittent blurry vision, urinary urgency, multiple joints pain, frequent headaches, insomnia, taking trazodone for sleep  Recent laboratory in March 2016 showed normal CBC, CMP, TSH   REVIEW OF SYSTEMS: Full 14 system review of systems performed and notable only for as above ALLERGIES: Allergies  Allergen Reactions  . Codeine Nausea Only    HOME MEDICATIONS: Current Outpatient Prescriptions  Medication Sig Dispense Refill  . ALPRAZolam (XANAX) 0.5 MG tablet 0.5 mg daily as needed.   2  . aspirin 81 MG tablet Take 81 mg by mouth daily.    . Cholecalciferol (VITAMIN D) 2000 UNITS CAPS Take by mouth daily.    . clonazePAM (KLONOPIN) 1 MG tablet 1/2 - 1 tab by mouth twice per day as needed 60 tablet 5  . cyclobenzaprine (FLEXERIL) 10 MG tablet Take 10 mg by mouth 3 (three) times daily as needed for muscle spasms.    Marland Kitchen HYDROCODONE-ACETAMINOPHEN PO Take 5-325 mg by mouth 3 (three) times daily as needed.     . meloxicam (MOBIC) 7.5 MG tablet 1 tab by mouth twice per day as needed for pain 60 tablet 3  . Probiotic Product (PROBIOTIC PO) Take by mouth.    . SUMAtriptan (IMITREX) 100 MG tablet Take 1 tablet (100 mg total) by mouth every 2 (two) hours as needed for migraine. May repeat in 2 hours if headache persists or  recurs., limit 10 tabs per month 30 tablet 1  . traZODone (DESYREL) 50 MG tablet TAKE 2 TABLETS BY MOUTH AT BEDTIME WHEN NECESSARY FOR INSOMNIA 180 tablet 1     PAST MEDICAL HISTORY: Past Medical History  Diagnosis Date  . Cervical dysplasia     young age, no treatment  . Personal history of tobacco use, presenting hazards to health   . Special screening for malignant neoplasms, colon 2013  . Carcinoma in situ of breast 2005  . Obesity, unspecified   . IBS (irritable bowel syndrome) 2005  . Arthritis   . Meningitis Oct 2014  . 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   . Basal cell cancer 2010    Left upper arm  . Osteoarthritis     PAST SURGICAL HISTORY: Past Surgical History  Procedure Laterality Date  . Oophorectomy      BSO  . Lipoma excision    . Abdominal hysterectomy  2006    TAH BSO  . Colonoscopy  2008    [name]  . Laparoscopy  2005  . Nasal septum surgery  may 2014  . Lumbar puncture  Oct 2014  . Breast surgery Left September 2005    wide local excision, 6:00 position.  . Breast biopsy  2005  . Skin cancer excision      Left arm  . Lasik Bilateral     FAMILY HISTORY: Family  History  Problem Relation Age of Onset  . Hypertension Mother   . Hyperlipidemia Mother   . Hypertension Father   . Cancer Father     Multiple Myloma  . Breast cancer Maternal Aunt 90  . Diabetes Maternal Aunt     SOCIAL HISTORY:  History   Social History  . Marital Status: Married    Spouse Name: N/A  . Number of Children: 0  . Years of Education: 13.5   Occupational History  . Program Communication Specialist    Social History Main Topics  . Smoking status: Current Every Day Smoker -- 0.50 packs/day    Types: Cigarettes  . Smokeless tobacco: Not on file  . Alcohol Use: Yes     Comment: Rare  . Drug Use: No  . Sexual Activity: No     Comment: HYST   Other Topics Concern  . Not on file   Social History  Narrative   Lives at home with husband and mother.   Right-handed.   4-6 cups/caffeine    PHYSICAL EXAM   Filed Vitals:   11/22/14 0830  BP: 111/74  Pulse: 76  Height: 5\' 4"  (1.626 m)  Weight: 173 lb (78.472 kg)    Not recorded      Body mass index is 29.68 kg/(m^2).  PHYSICAL EXAMNIATION:  Gen: NAD, conversant, well nourised, obese, well groomed                     Cardiovascular: Regular rate rhythm, no peripheral edema, warm, nontender. Eyes: Conjunctivae clear without exudates or hemorrhage Neck: Supple, no carotid bruise. Pulmonary: Clear to auscultation bilaterally   NEUROLOGICAL EXAM:  MENTAL STATUS: Speech:    Speech is normal; fluent and spontaneous with normal comprehension.  Cognition:    The patient is oriented to person, place, and time;     recent and remote memory intact;     language fluent;     normal attention, concentration,     fund of knowledge.  CRANIAL NERVES: CN II: Visual fields are full to confrontation. Fundoscopic exam is normal with sharp discs and no vascular changes. Venous pulsations are present bilaterally. Pupils are 4 mm and briskly reactive to light. Visual acuity is 20/20 bilaterally. CN III, IV, VI: extraocular movement are normal. No ptosis. CN V: Facial sensation is intact to pinprick in all 3 divisions bilaterally. Corneal responses are intact.  CN VII: Face is symmetric with normal eye closure and smile. CN VIII: Hearing is normal to rubbing fingers CN IX, X: Palate elevates symmetrically. Phonation is normal. CN XI: Head turning and shoulder shrug are intact CN XII: Tongue is midline with normal movements and no atrophy.  MOTOR: There is no pronator drift of out-stretched arms. Muscle bulk and tone are normal. Muscle strength is normal.   Shoulder abduction Shoulder external rotation Elbow flexion Elbow extension Wrist flexion Wrist extension Finger abduction Hip flexion Knee flexion Knee extension Ankle dorsi flexion  Ankle plantar flexion  R 5 5 5 5 5 5 5 5 5 5 5 5   L 5 5 5 5 5 5 5 5 5 5 5 5     REFLEXES: Reflexes are 2+ and symmetric at the biceps, triceps, knees, and ankles. Plantar responses are flexor.  SENSORY: Light touch, pinprick, position sense, and vibration sense are intact in fingers and toes.  COORDINATION: Rapid alternating movements and fine finger movements are intact. There is no dysmetria on finger-to-nose and heel-knee-shin. There are no abnormal or  extraneous movements.   GAIT/STANCE: Posture is normal. Gait is steady with normal steps, base, arm swing, and turning. Heel and toe walking are normal. Tandem gait is normal.  Romberg is absent.   DIAGNOSTIC DATA (LABS, IMAGING, TESTING) - I reviewed patient records, labs, notes, testing and imaging myself where available.  Lab Results  Component Value Date   WBC 6.9 10/10/2014   HGB 14.9 10/10/2014   HCT 43.1 10/10/2014   MCV 88.9 10/10/2014   PLT 296.0 10/10/2014      Component Value Date/Time   NA 140 10/10/2014 0953   K 4.1 10/10/2014 0953   CL 104 10/10/2014 0953   CO2 30 10/10/2014 0953   GLUCOSE 94 10/10/2014 0953   BUN 11 10/10/2014 0953   CREATININE 0.87 10/10/2014 0953   CREATININE 0.94 10/19/2013 1640   CALCIUM 9.7 10/10/2014 0953   PROT 7.3 10/10/2014 0953   ALBUMIN 4.3 10/10/2014 0953   AST 16 10/10/2014 0953   ALT 19 10/10/2014 0953   ALKPHOS 78 10/10/2014 0953   BILITOT 0.3 10/10/2014 0953   GFRNONAA 81.28 11/16/2008 0901   Lab Results  Component Value Date   CHOL 204* 10/10/2014   HDL 81.30 10/10/2014   LDLCALC 106* 10/10/2014   TRIG 85.0 10/10/2014   CHOLHDL 3 10/10/2014   No results found for: HGBA1C No results found for: VITAMINB12 Lab Results  Component Value Date   TSH 0.94 10/10/2014      ASSESSMENT AND PLAN  Jacqueline Lewis is a 54 y.o. female was constellation of complaints, including right side paresthesia, joints pain, left side decreased sensation, blurry vision, memory  trouble, Mini-Mental Status Examination 29 out of 30  1, MRI of brain and central nervous system structural lesion 2. Laboratory evaluation to rule out inflammatory process.     Marcial Pacas, M.D. Ph.D.  [facility] [address] [place], [place] [address] Ph: [phone] Fax: [phone]

## 2014-11-23 NOTE — Discharge Summary (Signed)
PATIENT NAME:  Jacqueline Lewis, Jacqueline Lewis MR#:  [id number] DATE OF BIRTH:  05/24/1961  DATE OF ADMISSION:  05/17/2013 DATE OF DISCHARGE:  05/19/2013  DISCHARGE DIAGNOSES: 1.  Aseptic meningitis.  2. Tobacco abuse.   CONSULTANTS: Dr. Melrose Nakayama.   IMAGING STUDIES DONE: Include a CT scan of the head without contrast, which showed no acute abnormalities.   Chest x-ray showed chronic changes of COPD, no other abnormalities.   ADMITTING HISTORY AND PHYSICAL: Please see detailed H and P dictated by Dr. Waldron Labs on 05/17/2013. In brief, this 54 year old female patient with past history of tobacco abuse presented to the hospital complaining of headache, fevers. The patient had an LP done, which showed 152 WBC with predominance of lymphocytes, diagnosed with aseptic meningitis, admitted to the hospitalist service.   HOSPITAL COURSE: Aseptic meningitis: The patient had CSF cultures sent, which were negative. HSV-1 and 2 PCR were sent, which have been negative. Acyclovir has been started. With HSV being negative, the patient's acyclovir was stopped. The patient is afebrile by the day of discharge. She likely has aseptic meningitis due to other viral causes and will be symptomatically treated. She does not have any neurological deficits on physical examination with motor strength 5/5, sensation to fine touch intact all over. Headache is resolved. The patient was seen by Dr. Melrose Nakayama of neurology during the hospital stay.   The patient was counseled to quit smoking.   DISCHARGE MEDICATIONS: Include:  1.  Tessalon Perles every 6 hours as needed.  2.  Trazodone 100 mg oral once a day at bedtime.  3.  Advil 200 mg tablets 2 tablets 3 times a day as needed for headache.   DISCHARGE INSTRUCTIONS: The patient has been instructed to return to the Emergency Room if she has any recurrence of fevers or any focal neurological deficits. Follow up with primary care physician in 1 to 2 weeks.   TIME SPENT: On day of discharge in  discharge activity was 35 minutes.   ____________________________ [name], MD srs:jm D: 05/19/2013 15:47:00 ET T: 05/19/2013 16:41:46 ET JOB#: [id number]  cc: Alveta Heimlich R. Geronimo Diliberto, MD, <Dictator> Neita Carp MD ELECTRONICALLY SIGNED 05/31/2013 13:10

## 2014-11-23 NOTE — Consult Note (Signed)
Referring Physician:  Albertine Patricia :   Reason for Consult: Admit Date: 17-May-2013  Chief Complaint: Headache and fever  Reason for Consult: meningitis   History of Present Illness: History of Present Illness:   54 year old woman with headaches, fevers and generalized weakness admitted and found via spinal tap to have aseptic meningitis.  Fevers up to 103 at home per husband at bedside.  Had LP done, showed 123 WBC in CSF with 67% lymphocytes.  Started on IV acyclovir and headaches feeling much better.  At present, says headache symptoms are mild.  Holocephalic and in the neck as well.  Still some neck stiffness but this has improved a lot over the past 24 hours or so.  Symptoms improving.  No clear provoking factors.  Acyclovir making symptoms better she thinks.  No other complaints.  No focal neurologic deficits such as weakness, numbness. MEDICAL HISTORY:  None.  SURGICAL HISTORY:  Nose surgery.  Lumpectomy. Hysterectomy.  HISTORY: yes1/2 pack per day   stopped 3 days ago nodrug use: no Works as a Development worker, community at [employer]. HISTORY: No known FH of meningitis. MEDICATIONS: Trazodone 100 mg oral at bedtime.  Compazine 10 mg every six hours as needed.  Albuterol as needed.  Percocet 5/325 every six hours as needed.   ALLERGIES:     ROS:  General pain    HEENT no complaints    Lungs no complaints    Cardiac no complaints    GI no complaints    GU no complaints    Musculoskeletal no complaints    Extremities no complaints    Skin no complaints    Endocrine no complaints    Psych no complaints    Past Medical/Surgical Hx:  Breast Cancer: L breast - 2005  Hysterectomy - Total: 2006  Lumpectomy:   Home Medications: Medication Instructions Last Modified Date/Time  albuterol CFC free 90 mcg/inh aerosol 2-4 puff(s) inhaled every 4 hours as needed for coughing/wheezing/shortness of breath 14-Oct-14 23:21  Compazine 10 mg oral tablet 1 tab(s) orally  every 6 hours, As Needed - for Nausea, Vomiting or Migraine 14-Oct-14 23:21  Tessalon Perles 100 mg oral capsule 1 tab(s) PO 4 times a day as needed  for cough 14-Oct-14 23:21  Percocet 5/325 oral tablet 1 tab(s) PO every 6 hours PRN Pain   14-Oct-14 23:21  traZODone 100 mg oral tablet  orally once a day (at bedtime) 14-Oct-14 23:21   [facility] Current Meds:   Acetaminophen * tablet, ( Tylenol (325 mg) tablet)  650 mg Oral q4h PRN for pain or temp. greater than 100.4  - Indication: Pain/Fever  Docusate Sodium capsule, ( Colace)  100 mg Oral bid PRN for constipation  - Indication: Stool Softener  Ondansetron injection, ( Zofran injection )  4 mg, IV push, q4h PRN for Nausea/Vomiting  Indication: Nausea/ Vomiting  Pantoprazole tablet, 40 mg Oral q6am  - Indication: Erosive Esophagitis/ GERD  Instructions:  DO NOT CRUSH  Senna tablet, ( Senokot)  1 tablet(s) Oral bid PRN for constipation  - Indication: Stool Softner/ Constipation/ Bowel Prep for Surgery  Instructions:  1 tablet = 8.6 mg  Acetaminophen-oxyCODONE 325/5 mg tablet,  ( Percocet 5-325)  1 tablet(s) Oral q4h PRN for pain  - Indication: Pain  Instructions:  [Med Admin Window: 30 mins before or after scheduled dose]  Guaifenesin Syrup,  ( Robitussin syrup )  10 ml Oral q6h PRN for cough  -Indication:Cough  Nursing Saline Flush, 3  to 6 ml, IV push, Q1M PRN for IV Maintenance  Ibuprofen tablet, 400 mg Oral q6h PRN for headache  - Indication: Analgesic/ Antipyretic/ Antiinflammatory  Acyclovir injection, 750 mg in Dextrose 5% 250 ml, IV Piggyback, q8h, Infuse over 60 minute(s)  Indication: Antiviral/Herpes Simplex/Genital Herpes/Herpes Zoster/Chickenpox  Chlorpheniramine-HYDROcodone suspension,  ( Tussionex suspension )  5 ml Oral q12h PRN for cough  -Indication:Antitussive/ Expectorant/ Cough  traZODONE tablet, ( Desyrel)  100 mg Oral at bedtime  - Indication: Depression, muscle spasm  HePARin injection, 5000 unit(s),  Subcutaneous, q8h  Indication: Anticoagulant, Monitor Anticoags per hospital protocol  Allergies:  No Known Allergies:   Vital Signs: **Vital Signs.:   15-Oct-14 08:55  Temperature Temperature (F) 101.2  Celsius 38.4  Temperature Source oral  Pulse Pulse 105  Respirations Respirations 20  Systolic BP Systolic BP 093  Diastolic BP (mmHg) Diastolic BP (mmHg) 82  Mean BP 103  Pulse Ox % Pulse Ox % 97  Pulse Ox Activity Level  At rest  Oxygen Delivery Room Air/ 21 %   EXAM: PHYSICAL EXAMINATION  GENERAL: Pleasant.  NAD.  Normocephalic and atraumatic.  EYES: Funduscopic exam shows normal disc size, appearance and C/D ratio without clear evidence of papilledema.  CARDIOVASCULAR: S1 and S2 sounds are within normal limits, without murmurs, gallops, or rubs.  MUSCULOSKELETAL: Still some neck stiffness, unable to touch chin to chest. Bulk - Normal Tone - Normal Pronator Drift - Absent bilaterally. Ambulation - Gait and station are within normal limits. Romberg - Absent  R/L 5/5    Shoulder abduction (deltoid/supraspinatus, axillary/suprascapular n, C5) 5/5    Elbow flexion (biceps brachii, musculoskeletal n, C5-6) 5/5    Elbow extension (triceps, radial n, C7) 5/5    Finger adduction (interossei, ulnar n, T1)   5/5    Hip flexion (iliopsoas, L1/L2) 5/5    Knee flexion (hamstrings, sciatic n, L5/S1) 5/5    Knee extension (quadriceps, femoral n, L3/4) 5/5    Ankle dorsiflexion (tibialis anterior, deep fibular n, L4/5) 5/5    Ankle plantarflexion (gastroc, tibial n, S1)  NEUROLOGICAL: MENTAL STATUS: Patient is oriented to person, place and time.  Recent and remote memory are intact.  Attention span and concentration are intact.  Naming, repetition, comprehension and expressive speech are within normal limits.  Patient's fund of knowledge is within normal limits for educational level.  CRANIAL NERVES: Normal    CN II (normal visual acuity and visual fields) Normal    CN  III, IV, VI (extraocular muscles are intact) Normal    CN V (facial sensation is intact bilaterally) Normal    CN VII (facial strength is intact bilaterally) Normal    CN VIII (hearing is intact bilaterally) Normal    CN IX/X (palate elevates midline, normal phonation) Normal    CN XI (shoulder shrug strength is normal and symmetric) Normal    CN XII (tongue protrudes midline)   SENSATION: Intact to pain and temp bilaterally (spinothalamic tracts) Intact to position and vibration bilaterally (dorsal columns)   REFLEXES: R/L 2+/2+    Biceps 2+/2+    Brachioradialis   2+/2+    Patellar 2+/2+    Achilles   COORDINATION/CEREBELLAR: Finger to nose testing is within normal limits..  Lab Results:  Hepatic:  14-Oct-14 21:35   Bilirubin, Total 0.3  Alkaline Phosphatase 84  SGPT (ALT) 20  SGOT (AST) 27  Total Protein, Serum 7.4  Albumin, Serum 3.8  Routine Micro:  14-Oct-14 23:11   Micro Text Report CSF CULTURE/AERO/GRAM  ST   GRAM STAIN                NO ORGANISMS SEEN   GRAM STAIN                MANY RED BLOOD CELLS   GRAM STAIN                FEW WHITE BLOOD CELLS   ANTIBIOTIC                       Gram Stain 1 NO ORGANISMS SEEN  Gram Stain 2 MANY RED BLOOD CELLS  Gram Stain 3 FEW WHITE BLOOD CELLS  Result(s) reported on 17 May 2013 at 12:46AM.  Routine Chem:  14-Oct-14 21:35   Glucose, Serum  123  BUN 8  Creatinine (comp) 0.99  Sodium, Serum 139  Potassium, Serum  3.4  Chloride, Serum 105  CO2, Serum 29  Calcium (Total), Serum 9.2  Osmolality (calc) 277  eGFR (African American) >60  eGFR (Non-African American) >60 (eGFR values <67mL/min/1.73 m2 may be an indication of chronic kidney disease (CKD). Calculated eGFR is useful in patients with stable renal function. The eGFR calculation will not be reliable in acutely ill patients when serum creatinine is changing rapidly. It is not useful in  patients on dialysis. The eGFR calculation may not be applicable to  patients at the low and high extremes of body sizes, pregnant women, and vegetarians.)  Anion Gap  5  CSF Analysis:  14-Oct-14 23:11   CSF Tube # 4  Color - Uncentrifuged (CSF) COLORLESS  Clarity (CSF) CLEAR  Color - Centrifuged (CSF) COLORLESS  RBC  (CSF) 593  WBC  (CSF) 118  Neutrophils  (CSF) 8  Lymphocytes  (CSF) 85  Monocytes/Macrophages  (CSF) 7  Eosinophil  (CSF) 0  Other Cells  (CSF) 0 (Result(s) reported on 17 May 2013 at 01:22AM.)  Protein, CSF 36 (Result(s) reported on 17 May 2013 at 12:06AM.)  Glucose, CSF 65 (Result(s) reported on 17 May 2013 at 12:06AM.)  Routine Hem:  14-Oct-14 21:35   WBC (CBC) 9.4  RBC (CBC) 4.62  Hemoglobin (CBC) 14.3  Hematocrit (CBC) 40.3  Platelet Count (CBC) 249 (Result(s) reported on 16 May 2013 at 09:53PM.)  MCV 87  MCH 30.9  MCHC 35.4  RDW 13.0   Radiology Results: CT:    14-Oct-14 21:09, CT Head Without Contrast  CT Head Without Contrast   REASON FOR EXAM:    fever headache  COMMENTS:       PROCEDURE: CT  - CT HEAD WITHOUT CONTRAST  - May 16 2013  9:09PM     RESULT: Axial noncontrast CT scanning was performed through the brain   with reconstructions at 5 mm intervals and slice thicknesses.    The ventricles are normal in size and position. There are punctate basal   ganglia calcifications bilaterally. There is no intracranial hemorrhage   nor intracranial mass effect. There is no evidence of an evolving   ischemic infarction. The cerebellum and brainstem are normal in density.    At bone window settings there is no evidence of an acute skull fracture.  IMPRESSION:  There is no acute intracranial abnormality.     Dictation Site: 5        Verified By: DAVID A. Martinique, M.D., MD   Impression/Recommendations: Recommendations:   54 year old woman with headaches, fevers and generalized weakness admitted and found via spinal tap to have aseptic meningitis.  culture no growth.  Lymphocyte predominance with elevated WBC  concerning for aseptic meningitis.  Agree with continued acyclovir treatment.  Symptoms significantly improving over the past 24 hours.  No additional imaging or treatment indicated at this time.  Will continue to follow to ensure resolution.  Not yet able to touch chin to chest.  Still intermittent fevers.  No other abnormalities on exam to warrant additional testing at this time. have reviewed the results of the most recent labs as outlined above and answered all related questions.  and coordinated plan of care with hospitalist.   Electronic Signatures: Anabel Bene (MD)  (Signed 16-Oct-14 06:16)  Authored: REFERRING PHYSICIAN, Consult, History of Present Illness, Review of Systems, PAST MEDICAL/SURGICAL HISTORY, HOME MEDICATIONS, Current Medications, ALLERGIES, NURSING VITAL SIGNS, Physical Exam-, LAB RESULTS, RADIOLOGY RESULTS, Recommendations   Last Updated: 16-Oct-14 06:16 by Anabel Bene (MD)

## 2014-11-23 NOTE — H&P (Signed)
PATIENT NAME:  Jacqueline Lewis, Jacqueline Lewis MR#:  [id number] DATE OF BIRTH:  [birth year]  DATE OF ADMISSION:  05/17/2013  REFERRING PHYSICIAN:  Dr. Eula Listen.   PRIMARY CARE PHYSICIAN:  None.   CHIEF COMPLAINT:  Headache and fever.   HISTORY OF PRESENT ILLNESS:  This is a 54 year old female without significant past medical history presents with complaints of headache and fever and generalized weakness, nausea and vomiting, the patient reports she has not been feeling well for the last week, she presented yesterday to ED, where she refused a spinal tap, the patient presents again today with worsening of her symptoms, reportedly she had as well nausea and vomiting, not able to keep down fluids, as well she reports she had fever at home 102 to 104, as well complaining of headache as well, the patient's temperature here was 101.6, the patient's CT head was done.  It did not show any acute intracranial abnormality, she had CSF tap done which did show 123 white blood cells of which 67% is lymphocytes, as well she had red blood cells 575, with protein of 36 and glucose of 65, the patient was started on IV acyclovir for suspicion of aseptic meningitis and hospitalist service were requested to admit the patient for further treatment, the patient denies any chest pain, any shortness of breath, any focal deficits, any tingling or numbness, any loss of consciousness or altered mental status.  The patient did not have any meningeal signs, the patient did not have any complaints of neck pain, reports she has a history of chronic neck pain due to arthritis, which has not changed.  PAST MEDICAL HISTORY:  None.   PAST SURGICAL HISTORY:  1.  Nose surgery.  2.  Lumpectomy. 3.  Hysterectomy.   ALLERGIES:  No known drug allergies.   HOME MEDICATIONS: 1.  Trazodone 100 mg oral at bedtime.  2.  Compazine 10 mg every six hours as needed.  3.  Albuterol as needed.  4.  Percocet 5/325 every six hours as needed.    SOCIAL HISTORY:  The patient quit smoking before three days ago, used to smoke half pack per day.  No alcohol.  No illicit drug use.  Works as a Development worker, community at [employer].   FAMILY HISTORY:  Denies any family history of coronary artery disease or diabetes mellitus.   REVIEW OF SYSTEMS: CONSTITUTIONAL:  The patient complains of fever, chills, fatigue, weakness.  Denies weight gain, weight loss.  EYES:  Denies blurry vision, double vision, inflammation, glaucoma.  EARS, NOSE, THROAT:  Denies tinnitus, ear pain, hearing loss, epistaxis or discharge.  RESPIRATORY:  Denies cough, wheezing, hemoptysis, dyspnea, COPD.  CARDIOVASCULAR:  Denies chest pain, edema, arrhythmia, palpitations, syncope.  GASTROINTESTINAL:  Complains of nausea, vomiting.  Denies diarrhea, abdominal pain, hematemesis, melena, rectal bleed.  GENITOURINARY:  Denies dysuria, hematuria, renal colic.  ENDOCRINE:  Denies polyuria, polydipsia, heat or cold intolerance.  HEMATOLOGY:  Denies anemia, easy bruising, bleeding diathesis.  INTEGUMENTARY:  Denies any acne, rash or skin lesions.  MUSCULOSKELETAL:  Denies any arthritis, cramps, swelling, gout, complains of neck pain, which has been chronic going on for years, nothing recent.  NEUROLOGIC:  Denies CVA, TIA, tingling, numbness, weakness or any focal deficits.  PSYCHIATRIC:  Denies anxiety, insomnia, schizophrenia.     PHYSICAL EXAMINATION: VITAL SIGNS:  Temperature 97.9, T-max 101.6, pulse 95, respiratory rate 20, blood pressure 147/70, saturating 98% on room air.  GENERAL:  Well-nourished female who looks comfortable, in no apparent distress.  HEENT:  Head atraumatic, normocephalic.  Pupils are equal, reactive to light.  Pink conjunctivae.  Anicteric sclerae.  Moist oral mucosa.  NECK:  Supple.  No thyromegaly.  No JVD.  Negative meningeal signs.  CHEST:  Good air entry bilaterally.  No wheezing, rales or rhonchi.  CARDIOVASCULAR:  S1, S2 heard.  No rubs,  murmur or gallops.  ABDOMEN:  Soft, nontender, nondistended.  Bowel sounds present.  EXTREMITIES:  No edema.  No clubbing.  No cyanosis.  PSYCHIATRIC:  Appropriate affect.  Awake, alert x 3.  Intact judgment and insight.  NEUROLOGIC:  Cranial nerves grossly intact.  Motor 5 out of 5.  No focal deficits.  LYMPHATIC:  No cervical or supraclavicular lymphadenopathy.  MUSCULOSKELETAL:  No joint effusion or erythema.   PERTINENT LABORATORY DATA:  Glucose 123, BUN 8, creatinine 0.99, sodium 139, potassium 3.4, chloride 105, CO2 29.  Anion gap 5.  ALT 20, AST 27, alk phos 84.  White blood cells 9.4, hemoglobin 14.3, hematocrit 40.3, platelets 249.  CSF clear, 575 red blood cells, 123 white blood cells, 21 neutrophils, 67 lymphocytes, 12 monocytes, glucose CSF 65, protein CSF 36.  Gram stain showing no organisms.   IMAGING STUDIES:  CT head showing no acute intracranial abnormality.   ASSESSMENT AND PLAN:  This is a 54 year old female presents with fever, headache, no meningeal sign, her spinal tap significant for aseptic meningitis.  1.  Aseptic meningitis, the patient will be started acyclovir 750 mg every eight hours IV, we will consult neurology service, we will follow on her CSF cultures, but so far there is no organism seen on Gram stain.  2.  DVT prophylaxis.  SubQ heparin.  3.  GI prophylaxis.  On PPI.  4.  CODE STATUS:  FULL CODE.   Total time spent on admission and patient care 40 minutes.    ____________________________ Albertine Patricia, MD dse:ea D: 05/17/2013 02:59:30 ET T: 05/17/2013 04:03:21 ET JOB#: [id number]  cc: Albertine Patricia, MD, <Dictator> Jeniyah Menor Graciela Husbands MD ELECTRONICALLY SIGNED 05/17/2013 5:26

## 2014-11-26 ENCOUNTER — Telehealth: Payer: Self-pay | Admitting: *Deleted

## 2014-11-26 ENCOUNTER — Telehealth: Payer: Self-pay | Admitting: Neurology

## 2014-11-26 NOTE — Telephone Encounter (Signed)
I called lab results to patient.  While I was on the phone, she asked me to check on her MRI appt.  Thank you.

## 2014-11-26 NOTE — Telephone Encounter (Signed)
LVM to schedule MRI

## 2014-11-28 ENCOUNTER — Ambulatory Visit (INDEPENDENT_AMBULATORY_CARE_PROVIDER_SITE_OTHER): Payer: BLUE CROSS/BLUE SHIELD

## 2014-11-28 DIAGNOSIS — R202 Paresthesia of skin: Secondary | ICD-10-CM

## 2014-11-28 LAB — VITAMIN D 1,25 DIHYDROXY
Vitamin D 1, 25 (OH)2 Total: 26 pg/mL
Vitamin D2 1, 25 (OH)2: <10 pg/mL
Vitamin D3 1, 25 (OH)2: 26 pg/mL

## 2014-11-28 LAB — C-REACTIVE PROTEIN: CRP: 2.3 mg/L (ref 0.0–4.9)

## 2014-11-28 LAB — ANA W/REFLEX IF POSITIVE: Anti Nuclear Antibody(ANA): NEGATIVE

## 2014-11-28 LAB — SEDIMENTATION RATE: Sed Rate: 4 mm/hr (ref 0–40)

## 2014-11-28 LAB — CK: Total CK: 105 U/L (ref 24–173)

## 2014-11-30 ENCOUNTER — Telehealth: Payer: Self-pay | Admitting: Neurology

## 2014-11-30 NOTE — Telephone Encounter (Signed)
Please call patient MRI of the brain showed no significant abnormality, if she has further questions, will address at her follow-up appointment  IMPRESSION:  Equivocal MRI brain (without) demonstrating: 1. Few (~5-6) punctate periventricular and subcortical foci of non-specific T2 hyperintensities. These findings are non-specific and considerations include autoimmune, inflammatory, post-infectious, microvascular ischemic or migraine associated etiologies. 2. No acute findings.

## 2014-11-30 NOTE — Telephone Encounter (Signed)
Pt aware of results 

## 2014-12-03 ENCOUNTER — Ambulatory Visit (INDEPENDENT_AMBULATORY_CARE_PROVIDER_SITE_OTHER): Payer: BLUE CROSS/BLUE SHIELD | Admitting: Gynecology

## 2014-12-03 ENCOUNTER — Encounter: Payer: Self-pay | Admitting: Gynecology

## 2014-12-03 VITALS — BP 112/78 | Ht 66.0 in | Wt 178.0 lb

## 2014-12-03 DIAGNOSIS — R3915 Urgency of urination: Secondary | ICD-10-CM

## 2014-12-03 DIAGNOSIS — Z01419 Encounter for gynecological examination (general) (routine) without abnormal findings: Secondary | ICD-10-CM

## 2014-12-03 DIAGNOSIS — C50912 Malignant neoplasm of unspecified site of left female breast: Secondary | ICD-10-CM

## 2014-12-03 DIAGNOSIS — G4700 Insomnia, unspecified: Secondary | ICD-10-CM

## 2014-12-03 NOTE — Progress Notes (Signed)
Jacqueline Lewis [birth year] [id number]        54 y.o.  G0P0 for annual exam.  Several issues noted below.  Past medical history,surgical history, problem list, medications, allergies, family history and social history were all reviewed and documented as reviewed in the EPIC chart.  ROS:  Performed with pertinent positives and negatives included in the history, assessment and plan.   Additional significant findings :  none   Exam: Kim Counsellor Vitals:   12/03/14 1600  BP: 112/78  Height: 5\' 6"  (1.676 m)  Weight: 178 lb (80.74 kg)   General appearance:  Normal affect, orientation and appearance. Skin: Grossly normal HEENT: Without gross lesions.  No cervical or supraclavicular adenopathy. Thyroid normal.  Lungs:  Clear without wheezing, rales or rhonchi Cardiac: RR, without RMG Abdominal:  Soft, nontender, without masses, guarding, rebound, organomegaly or hernia Breasts:  Examined lying and sitting without masses, retractions, discharge or axillary adenopathy. Pelvic:  Ext/BUS/vagina with mild atrophic changes  Adnexa  Without masses or tenderness    Anus and perineum  Normal   Rectovaginal  Normal sphincter tone without palpated masses or tenderness.    Assessment/Plan:  54 y.o. G0P0 female for annual exam.   1. Postmenopausal/atrophic genital changes.  Status post TAH/BSO 2006 for endometriosis. Not having significant hot flashes, night sweats, vaginal dryness. Is not sexually active. Continue to monitor and report any issues. 2. History of left breast cancer status post lumpectomy and radiation 2005. Exam NED.  Mammogram 08/2004. Continue with annual mammography. This being also reviewed. 3. Pap smear 2015. No Pap smear done today.  History of dysplasia as a young woman of unknown degree. All recent Pap smears normal. Options to stop screening as she is status post hysterectomy for benign indications versus less frequent intervals reviewed. Will readdress on annual  basis. 4. Colonoscopy 2009. Reported repeat interval 10 years. 5. DEXA never. Will plan closer to 60. Increase calcium and vitamin D reviewed. 6. Insomnia. Patient takes trazodone 100 mg at bedtime. She thinks Dr. Jenny Reichmann refilled this for her but will call if she needs more. Has done this for years without side effects. Was recently started on clonazepam by Dr. Jenny Reichmann for anxiety. She asked about also taking Xanax and I discouraged her from doing so and no refill was provided. 7. Urinary urgency. Patient notes urgency usually in the mornings. Has cut back on caffeine. Occasional incontinence. Behavior modification techniques reviewed. OAB medications discussed and offered but declined. Urology referral reviewed and declined. Will check urinalysis. 8. Health maintenance. No routine blood work done as Dr. Jenny Reichmann does her routine blood work. Follow up in one year, sooner as needed.     Anastasio Auerbach MD, 4:34 PM 12/03/2014

## 2014-12-03 NOTE — Patient Instructions (Signed)
You may obtain a copy of any labs that were done today by logging onto MyChart as outlined in the instructions provided with your AVS (after visit summary). The office will not call with normal lab results but certainly if there are any significant abnormalities then we will contact you.   Health Maintenance, Female A healthy lifestyle and preventative care can promote health and wellness.  Maintain regular health, dental, and eye exams.  Eat a healthy diet. Foods like vegetables, fruits, whole grains, low-fat dairy products, and lean protein foods contain the nutrients you need without too many calories. Decrease your intake of foods high in solid fats, added sugars, and salt. Get information about a proper diet from your caregiver, if necessary.  Regular physical exercise is one of the most important things you can do for your health. Most adults should get at least 150 minutes of moderate-intensity exercise (any activity that increases your heart rate and causes you to sweat) each week. In addition, most adults need muscle-strengthening exercises on 2 or more days a week.   Maintain a healthy weight. The body mass index (BMI) is a screening tool to identify possible weight problems. It provides an estimate of body fat based on height and weight. Your caregiver can help determine your BMI, and can help you achieve or maintain a healthy weight. For adults 20 years and older:  A BMI below 18.5 is considered underweight.  A BMI of 18.5 to 24.9 is normal.  A BMI of 25 to 29.9 is considered overweight.  A BMI of 30 and above is considered obese.  Maintain normal blood lipids and cholesterol by exercising and minimizing your intake of saturated fat. Eat a balanced diet with plenty of fruits and vegetables. Blood tests for lipids and cholesterol should begin at age 61 and be repeated every 5 years. If your lipid or cholesterol levels are high, you are over 50, or you are a high risk for heart  disease, you may need your cholesterol levels checked more frequently.Ongoing high lipid and cholesterol levels should be treated with medicines if diet and exercise are not effective.  If you smoke, find out from your caregiver how to quit. If you do not use tobacco, do not start.  Lung cancer screening is recommended for adults aged 33 80 years who are at high risk for developing lung cancer because of a history of smoking. Yearly low-dose computed tomography (CT) is recommended for people who have at least a 30-pack-year history of smoking and are a current smoker or have quit within the past 15 years. A pack year of smoking is smoking an average of 1 pack of cigarettes a day for 1 year (for example: 1 pack a day for 30 years or 2 packs a day for 15 years). Yearly screening should continue until the smoker has stopped smoking for at least 15 years. Yearly screening should also be stopped for people who develop a health problem that would prevent them from having lung cancer treatment.  If you are pregnant, do not drink alcohol. If you are breastfeeding, be very cautious about drinking alcohol. If you are not pregnant and choose to drink alcohol, do not exceed 1 drink per day. One drink is considered to be 12 ounces (355 mL) of beer, 5 ounces (148 mL) of wine, or 1.5 ounces (44 mL) of liquor.  Avoid use of street drugs. Do not share needles with anyone. Ask for help if you need support or instructions about stopping  the use of drugs.  High blood pressure causes heart disease and increases the risk of stroke. Blood pressure should be checked at least every 1 to 2 years. Ongoing high blood pressure should be treated with medicines, if weight loss and exercise are not effective.  If you are 59 to 54 years old, ask your caregiver if you should take aspirin to prevent strokes.  Diabetes screening involves taking a blood sample to check your fasting blood sugar level. This should be done once every 3  years, after age 91, if you are within normal weight and without risk factors for diabetes. Testing should be considered at a younger age or be carried out more frequently if you are overweight and have at least 1 risk factor for diabetes.  Breast cancer screening is essential preventative care for women. You should practice "breast self-awareness." This means understanding the normal appearance and feel of your breasts and may include breast self-examination. Any changes detected, no matter how small, should be reported to a caregiver. Women in their 66s and 30s should have a clinical breast exam (CBE) by a caregiver as part of a regular health exam every 1 to 3 years. After age 101, women should have a CBE every year. Starting at age 100, women should consider having a mammogram (breast X-ray) every year. Women who have a family history of breast cancer should talk to their caregiver about genetic screening. Women at a high risk of breast cancer should talk to their caregiver about having an MRI and a mammogram every year.  Breast cancer gene (BRCA)-related cancer risk assessment is recommended for women who have family members with BRCA-related cancers. BRCA-related cancers include breast, ovarian, tubal, and peritoneal cancers. Having family members with these cancers may be associated with an increased risk for harmful changes (mutations) in the breast cancer genes BRCA1 and BRCA2. Results of the assessment will determine the need for genetic counseling and BRCA1 and BRCA2 testing.  The Pap test is a screening test for cervical cancer. Women should have a Pap test starting at age 57. Between ages 25 and 35, Pap tests should be repeated every 2 years. Beginning at age 37, you should have a Pap test every 3 years as long as the past 3 Pap tests have been normal. If you had a hysterectomy for a problem that was not cancer or a condition that could lead to cancer, then you no longer need Pap tests. If you are  between ages 50 and 76, and you have had normal Pap tests going back 10 years, you no longer need Pap tests. If you have had past treatment for cervical cancer or a condition that could lead to cancer, you need Pap tests and screening for cancer for at least 20 years after your treatment. If Pap tests have been discontinued, risk factors (such as a new sexual partner) need to be reassessed to determine if screening should be resumed. Some women have medical problems that increase the chance of getting cervical cancer. In these cases, your caregiver may recommend more frequent screening and Pap tests.  The human papillomavirus (HPV) test is an additional test that may be used for cervical cancer screening. The HPV test looks for the virus that can cause the cell changes on the cervix. The cells collected during the Pap test can be tested for HPV. The HPV test could be used to screen women aged 44 years and older, and should be used in women of any age  who have unclear Pap test results. After the age of 55, women should have HPV testing at the same frequency as a Pap test.  Colorectal cancer can be detected and often prevented. Most routine colorectal cancer screening begins at the age of 44 and continues through age 20. However, your caregiver may recommend screening at an earlier age if you have risk factors for colon cancer. On a yearly basis, your caregiver may provide home test kits to check for hidden blood in the stool. Use of a small camera at the end of a tube, to directly examine the colon (sigmoidoscopy or colonoscopy), can detect the earliest forms of colorectal cancer. Talk to your caregiver about this at age 86, when routine screening begins. Direct examination of the colon should be repeated every 5 to 10 years through age 13, unless early forms of pre-cancerous polyps or small growths are found.  Hepatitis C blood testing is recommended for all people born from [birth year] through 1965 and any  individual with known risks for hepatitis C.  Practice safe sex. Use condoms and avoid high-risk sexual practices to reduce the spread of sexually transmitted infections (STIs). Sexually active women aged 36 and younger should be checked for Chlamydia, which is a common sexually transmitted infection. Older women with new or multiple partners should also be tested for Chlamydia. Testing for other STIs is recommended if you are sexually active and at increased risk.  Osteoporosis is a disease in which the bones lose minerals and strength with aging. This can result in serious bone fractures. The risk of osteoporosis can be identified using a bone density scan. Women ages 20 and over and women at risk for fractures or osteoporosis should discuss screening with their caregivers. Ask your caregiver whether you should be taking a calcium supplement or vitamin D to reduce the rate of osteoporosis.  Menopause can be associated with physical symptoms and risks. Hormone replacement therapy is available to decrease symptoms and risks. You should talk to your caregiver about whether hormone replacement therapy is right for you.  Use sunscreen. Apply sunscreen liberally and repeatedly throughout the day. You should seek shade when your shadow is shorter than you. Protect yourself by wearing long sleeves, pants, a wide-brimmed hat, and sunglasses year round, whenever you are outdoors.  Notify your caregiver of new moles or changes in moles, especially if there is a change in shape or color. Also notify your caregiver if a mole is larger than the size of a pencil eraser.  Stay current with your immunizations. Document Released: 02/02/2011 Document Revised: 11/14/2012 Document Reviewed: 02/02/2011 [facility] Patient Information 2014 [employer].

## 2014-12-04 LAB — URINALYSIS W MICROSCOPIC + REFLEX CULTURE
Bilirubin Urine: NEGATIVE
Casts: NONE SEEN
Crystals: NONE SEEN
GLUCOSE, UA: NEGATIVE mg/dL
HGB URINE DIPSTICK: NEGATIVE
Ketones, ur: NEGATIVE mg/dL
LEUKOCYTES UA: NEGATIVE
NITRITE: NEGATIVE
PROTEIN: NEGATIVE mg/dL
SPECIFIC GRAVITY, URINE: 1.014 (ref 1.005–1.030)
UROBILINOGEN UA: 0.2 mg/dL (ref 0.0–1.0)
pH: 6.5 (ref 5.0–8.0)

## 2014-12-05 ENCOUNTER — Other Ambulatory Visit: Payer: Self-pay | Admitting: Gynecology

## 2014-12-05 MED ORDER — SULFAMETHOXAZOLE-TRIMETHOPRIM 800-160 MG PO TABS: 1.0000 | ORAL_TABLET | Freq: Two times a day (BID) | ORAL | Status: DC

## 2014-12-06 LAB — URINE CULTURE

## 2014-12-11 ENCOUNTER — Ambulatory Visit (INDEPENDENT_AMBULATORY_CARE_PROVIDER_SITE_OTHER): Payer: Self-pay | Admitting: Neurology

## 2014-12-11 ENCOUNTER — Ambulatory Visit (INDEPENDENT_AMBULATORY_CARE_PROVIDER_SITE_OTHER): Payer: BLUE CROSS/BLUE SHIELD | Admitting: Neurology

## 2014-12-11 DIAGNOSIS — R202 Paresthesia of skin: Secondary | ICD-10-CM

## 2014-12-11 DIAGNOSIS — Z0289 Encounter for other administrative examinations: Secondary | ICD-10-CM

## 2014-12-11 MED ORDER — GABAPENTIN 300 MG PO CAPS: 300.0000 mg | ORAL_CAPSULE | Freq: Two times a day (BID) | ORAL | Status: DC

## 2014-12-11 NOTE — Progress Notes (Signed)
Electrodiagnostic study today was normal, there was no evidence of right upper extremity neuropathy or right cervical radiculopathy.  We have reviewed MRI of the brain, mild deep white matter nonspecific lesions, consistent with migraines  Her left-sided paresthesia often followed by her left-sided headaches, skin sensitivity, responded very well to Imitrex, I have started gabapentin 300 mg twice a day  Return to clinic in 2 months

## 2014-12-11 NOTE — Procedures (Signed)
   NCS (NERVE CONDUCTION STUDY) WITH EMG (ELECTROMYOGRAPHY) REPORT   STUDY DATE: Dec 11 2014 PATIENT NAME: ANARA COWMAN DOB: May 04, 1961 MRN: [id number]    TECHNOLOGIST: Towana Badger  ELECTROMYOGRAPHER: Marcial Pacas M.D.  CLINICAL INFORMATION: 54 years old female, presenting with intermittent right arm coldness sensation, also has intermittent left-sided paresthesia with associated headaches  FINDINGS: NERVE CONDUCTION STUDY: Right sural sensory response was normal. Right peroneal to EDB, tibial motor responses were normal. Right radial, median, ulnar sensory responses were normal. Right median, ulnar motor responses were normal.  NEEDLE ELECTROMYOGRAPHY: Selected needle examination was performed at right upper extremity muscles, right cervical paraspinal muscles.  Needle examination of right pronator teres, biceps, triceps, deltoid was normal.  There was no spontaneous activity at right cervical paraspinal muscles, right C5, 6 and 7.  IMPRESSION:   This is a normal study. There was no electrodiagnostic evidence of right upper extremity neuropathy, or right cervical radiculopathy.   INTERPRETING PHYSICIAN:   Marcial Pacas M.D. Ph.D. [facility] [address] [place], [place] [address] [phone]

## 2015-02-13 ENCOUNTER — Encounter: Payer: Self-pay | Admitting: Internal Medicine

## 2015-03-21 ENCOUNTER — Ambulatory Visit: Payer: BLUE CROSS/BLUE SHIELD | Admitting: Neurology

## 2015-04-11 ENCOUNTER — Other Ambulatory Visit: Payer: Self-pay | Admitting: Internal Medicine

## 2015-04-11 ENCOUNTER — Ambulatory Visit (INDEPENDENT_AMBULATORY_CARE_PROVIDER_SITE_OTHER): Payer: 59 | Admitting: Neurology

## 2015-04-11 ENCOUNTER — Encounter: Payer: Self-pay | Admitting: Neurology

## 2015-04-11 VITALS — BP 127/84 | HR 72 | Ht 66.0 in | Wt 173.0 lb

## 2015-04-11 DIAGNOSIS — R413 Other amnesia: Secondary | ICD-10-CM | POA: Diagnosis not present

## 2015-04-11 DIAGNOSIS — G43009 Migraine without aura, not intractable, without status migrainosus: Secondary | ICD-10-CM

## 2015-04-11 MED ORDER — AZITHROMYCIN 1 G PO PACK: 1.0000 g | PACK | Freq: Once | ORAL | Status: DC

## 2015-04-11 NOTE — Telephone Encounter (Signed)
Done hardcopy to Dahlia  

## 2015-04-11 NOTE — Progress Notes (Signed)
PATIENT: Jacqueline Lewis DOB: 07/15/1961  HISTORICAL  Jacqueline Lewis is a 54 years old right-handed female, referred by her rheumatologist Dr. Murlean Iba for evaluation of left-sided paresthesia  She had a history of left breast cancer, status post left lobectomy followed by radiation therapy in 2005  In 2015, she noticed intermittent right elbow pain, shoulder pain, sometimes woke up in the middle of the sleep, feeling coldness sensation involving her whole right arm, sometimes traveling down to her right leg,  Since September 2015, she also noticed intermittent left side numbness, tingling, droopiness involving left face, arm, leg,  She also complains intermittent blurry vision, urinary urgency, multiple joints pain, frequent headaches, insomnia, taking trazodone for sleep  Recent laboratory in March 2016 showed normal CBC, CMP, TSH  UPDATE Apr 11 2015: Chief Complaint  Patient presents with  . Numbness    She is still having bilateral numbness/tingling in her bilateral feet that has not improved with gabapentin.  . Migraine    She has had 2-3 headaches in the last month but says only one was severe enough to use Imitrex.  She has had additional stress from the recent passing of her mother on 03/24/15.    Electrodiagnostic study in May 2016 was normal, there was no evidence of right upper extremity neuropathy or right cervical radiculopathy.  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 Codeine Nausea Only    HOME MEDICATIONS: Current Outpatient Prescriptions  Medication Sig Dispense Refill  . ALPRAZolam (XANAX) 0.5 MG tablet 0.5 mg daily as needed.   2  . aspirin 81 MG tablet Take 81 mg by mouth daily.    . Cholecalciferol (VITAMIN D) 2000 UNITS CAPS Take by mouth daily.    . clonazePAM (KLONOPIN) 1 MG tablet 1/2 - 1 tab by mouth twice per day as needed 60 tablet 5  . cyclobenzaprine (FLEXERIL) 10 MG tablet Take 10 mg by mouth 3 (three) times daily as needed for muscle spasms.    Marland Kitchen HYDROCODONE-ACETAMINOPHEN PO Take 5-325 mg by mouth 3 (three) times daily as needed.     . meloxicam (MOBIC) 7.5 MG tablet 1 tab by mouth twice per day as needed for pain 60 tablet 3  . Probiotic Product (PROBIOTIC PO) Take by mouth.    . SUMAtriptan (IMITREX) 100 MG tablet Take 1 tablet (100 mg total) by mouth every 2 (two) hours as needed for migraine. May repeat in 2 hours if headache persists or recurs., limit 10 tabs per month 30 tablet 1  . traZODone (DESYREL) 50 MG tablet TAKE 2 TABLETS BY MOUTH AT BEDTIME WHEN NECESSARY FOR INSOMNIA 180 tablet 1     PAST MEDICAL HISTORY: Past Medical History  Diagnosis Date  . Cervical dysplasia     young age, no treatment  . Personal history of tobacco use, presenting hazards to health   . Special screening for malignant neoplasms, colon 2013  . Carcinoma in situ of breast 2005  . Obesity, unspecified   . IBS (  irritable bowel syndrome) 2005  . Arthritis   . Meningitis Oct 2014  . 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   . Basal cell cancer 2010    Left upper arm  . Osteoarthritis   . Migraines     PAST SURGICAL HISTORY: Past Surgical History  Procedure Laterality Date  . Oophorectomy      BSO  . Lipoma excision    . Abdominal hysterectomy  2006    TAH BSO  . Colonoscopy  2008    [name]  . Laparoscopy  2005  .  Nasal septum surgery  may 2014  . Lumbar puncture  Oct 2014  . Breast surgery Left September 2005    wide local excision, 6:00 position.  . Breast biopsy  2005  . Skin cancer excision      Left arm  . Lasik Bilateral   . Mohs surgery      FAMILY HISTORY: Family History  Problem Relation Age of Onset  . Hypertension Mother   . Hyperlipidemia Mother   . Hypertension Father   . Cancer Father     Multiple Myloma  . Breast cancer Maternal Aunt 90  . Diabetes Maternal Aunt     SOCIAL HISTORY:  Social History   Social History  . Marital Status: Married    Spouse Name: N/A  . Number of Children: 0  . Years of Education: 13.5   Occupational History  . Program Communication Specialist    Social History Main Topics  . Smoking status: Current Some Day Smoker -- 0.25 packs/day    Types: Cigarettes  . Smokeless tobacco: Not on file  . Alcohol Use: 0.0 oz/week    0 Standard drinks or equivalent per week     Comment: Rare  . Drug Use: No  . Sexual Activity: No     Comment: HYST-Pt refused sexual hx questions   Other Topics Concern  . Not on file   Social History Narrative   Lives at home with husband and mother.   Right-handed.   4-6 cups/caffeine    PHYSICAL EXAM   There were no vitals filed for this visit.  Not recorded      There is no weight on file to calculate BMI.  PHYSICAL EXAMNIATION:  Gen: NAD, conversant, well nourised, obese, well groomed                     Cardiovascular: Regular rate rhythm, no peripheral edema, warm, nontender. Eyes: Conjunctivae clear without exudates or hemorrhage Neck: Supple, no carotid bruise. Pulmonary: Clear to auscultation bilaterally   NEUROLOGICAL EXAM:  MENTAL STATUS: Speech:    Speech is normal; fluent and spontaneous with normal comprehension.  Cognition:    The patient is oriented to person, place, and time;     recent and remote memory intact;     language fluent;     normal attention, concentration,      fund of knowledge.  CRANIAL NERVES: CN II: Visual fields are full to confrontation. Fundoscopic exam is normal with sharp discs and no vascular changes. Venous pulsations are present bilaterally. Pupils are 4 mm and briskly reactive to light. Visual acuity is 20/20 bilaterally. CN III, IV, VI: extraocular movement are normal. No ptosis. CN V: Facial sensation is intact to pinprick in all 3 divisions bilaterally. Corneal responses are intact.  CN VII: Face is symmetric with normal eye closure and smile. CN  VIII: Hearing is normal to rubbing fingers CN IX, X: Palate elevates symmetrically. Phonation is normal. CN XI: Head turning and shoulder shrug are intact CN XII: Tongue is midline with normal movements and no atrophy.  MOTOR: There is no pronator drift of out-stretched arms. Muscle bulk and tone are normal. Muscle strength is normal.  REFLEXES: Reflexes are 2+ and symmetric at the biceps, triceps, knees, and ankles. Plantar responses are flexor.  SENSORY: Light touch, pinprick, position sense, and vibration sense are intact in fingers and toes.  COORDINATION: Rapid alternating movements and fine finger movements are intact. There is no dysmetria on finger-to-nose and heel-knee-shin. There are no abnormal or extraneous movements.   GAIT/STANCE: Posture is normal. Gait is steady with normal steps, base, arm swing, and turning. Heel and toe walking are normal. Tandem gait is normal.  Romberg is absent.   DIAGNOSTIC DATA (LABS, IMAGING, TESTING) - I reviewed patient records, labs, notes, testing and imaging myself where available.  Lab Results  Component Value Date   WBC 6.9 10/10/2014   HGB 14.9 10/10/2014   HCT 43.1 10/10/2014   MCV 88.9 10/10/2014   PLT 296.0 10/10/2014      Component Value Date/Time   NA 140 10/10/2014 0953   NA 137 05/18/2013 0738   K 4.1 10/10/2014 0953   K 3.3* 05/18/2013 0738   CL 104 10/10/2014 0953   CL 103 05/18/2013 0738   CO2 30 10/10/2014  0953   CO2 29 05/18/2013 0738   GLUCOSE 94 10/10/2014 0953   GLUCOSE 96 05/18/2013 0738   BUN 11 10/10/2014 0953   BUN 7 05/18/2013 0738   CREATININE 0.87 10/10/2014 0953   CREATININE 0.94 10/19/2013 1640   CREATININE 0.81 05/18/2013 0738   CALCIUM 9.7 10/10/2014 0953   CALCIUM 9.0 05/18/2013 0738   PROT 7.3 10/10/2014 0953   PROT 7.4 05/16/2013 2135   ALBUMIN 4.3 10/10/2014 0953   ALBUMIN 3.8 05/16/2013 2135   AST 16 10/10/2014 0953   AST 27 05/16/2013 2135   ALT 19 10/10/2014 0953   ALT 20 05/16/2013 2135   ALKPHOS 78 10/10/2014 0953   ALKPHOS 84 05/16/2013 2135   BILITOT 0.3 10/10/2014 0953   BILITOT 0.3 05/16/2013 2135   GFRNONAA >60 05/18/2013 0738   GFRNONAA 81.28 11/16/2008 0901   GFRAA >60 05/18/2013 0738   Lab Results  Component Value Date   CHOL 204* 10/10/2014   HDL 81.30 10/10/2014   LDLCALC 106* 10/10/2014   TRIG 85.0 10/10/2014   CHOLHDL 3 10/10/2014   No results found for: HGBA1C No results found for: [id number] Lab Results  Component Value Date   TSH 0.94 10/10/2014      ASSESSMENT AND PLAN  Jacqueline Lewis is a 54 y.o. female was constellation of complaints, including right side paresthesia, joints pain, left side decreased sensation, blurry vision, memory trouble,   Migraine headaches  Responded well to Imitrex as needed Right sided paresthesia  Continue gabapentin Complains of memory loss,  She recently lost her mother, stress anxiety might play a role as well,  Check vitamin B12,     Marcial Pacas, M.D. Ph.D.  [facility] [address], [place], [place] [address] Ph: [phone] Fax: [phone]

## 2015-04-12 ENCOUNTER — Telehealth: Payer: Self-pay | Admitting: *Deleted

## 2015-04-12 LAB — CBC
HEMATOCRIT: 42.3 % (ref 34.0–46.6)
Hemoglobin: 14.4 g/dL (ref 11.1–15.9)
MCH: 30.9 pg (ref 26.6–33.0)
MCHC: 34 g/dL (ref 31.5–35.7)
MCV: 91 fL (ref 79–97)
Platelets: 300 10*3/uL (ref 150–379)
RBC: 4.66 x10E6/uL (ref 3.77–5.28)
RDW: 14 % (ref 12.3–15.4)
WBC: 6.7 10*3/uL (ref 3.4–10.8)

## 2015-04-12 LAB — VITAMIN B12: VITAMIN B 12: 468 pg/mL (ref 211–946)

## 2015-04-12 NOTE — Telephone Encounter (Signed)
Called patient with normal results.

## 2015-04-12 NOTE — Telephone Encounter (Signed)
Rx faxed to pharmacy  

## 2015-04-12 NOTE — Telephone Encounter (Signed)
-----   Message from Marcial Pacas, MD sent at 04/12/2015  7:42 AM EDT ----- Please call patient for normal laboratory result

## 2015-05-27 ENCOUNTER — Telehealth: Payer: Self-pay

## 2015-05-27 NOTE — Telephone Encounter (Signed)
[facility] ([phone])  Clonazepam 1 mg refill request

## 2015-05-28 NOTE — Telephone Encounter (Signed)
Klonopin too soon, as last rx from sept 2016 should cover refills for 6 mo

## 2015-05-28 NOTE — Telephone Encounter (Signed)
Faxed to pharmacy (PCP response).

## 2015-07-03 ENCOUNTER — Encounter: Payer: Self-pay | Admitting: *Deleted

## 2015-07-18 ENCOUNTER — Telehealth: Payer: Self-pay | Admitting: Neurology

## 2015-07-18 MED ORDER — GABAPENTIN 300 MG PO CAPS: 300.0000 mg | ORAL_CAPSULE | Freq: Two times a day (BID) | ORAL | Status: DC

## 2015-07-18 NOTE — Telephone Encounter (Signed)
Pt called sts she need refill for gabapentin (NEURONTIN) 300 MG capsule . Pt sts she is taking 1 tab in am and 1 tab in pm. Pt increased about 3 wks ago to the 1 tab in am which has helped with numbness and throbbing feet. She has no discomfort in feet now. She sts it was prescribed for arm but she feels she has a neuropathy so she increased med for this reason.

## 2015-07-21 ENCOUNTER — Other Ambulatory Visit: Payer: Self-pay

## 2015-07-21 MED ORDER — GABAPENTIN 300 MG PO CAPS: 300.0000 mg | ORAL_CAPSULE | Freq: Two times a day (BID) | ORAL | Status: DC

## 2015-08-06 ENCOUNTER — Telehealth: Payer: Self-pay | Admitting: *Deleted

## 2015-08-06 MED ORDER — TRAZODONE HCL 50 MG PO TABS: ORAL_TABLET | ORAL | Status: DC

## 2015-08-06 NOTE — Telephone Encounter (Signed)
Received call pt is needing a refill on her Trazodone sent to [facility]. Inform pt will send electronically CPX is due in March...Johny Chess

## 2015-08-20 ENCOUNTER — Ambulatory Visit: Payer: 59 | Admitting: General Surgery

## 2015-10-09 ENCOUNTER — Encounter: Payer: Self-pay | Admitting: Neurology

## 2015-10-09 ENCOUNTER — Ambulatory Visit (INDEPENDENT_AMBULATORY_CARE_PROVIDER_SITE_OTHER): Payer: 59 | Admitting: Neurology

## 2015-10-09 VITALS — Ht 66.0 in | Wt 179.0 lb

## 2015-10-09 DIAGNOSIS — R202 Paresthesia of skin: Secondary | ICD-10-CM | POA: Diagnosis not present

## 2015-10-09 DIAGNOSIS — G43809 Other migraine, not intractable, without status migrainosus: Secondary | ICD-10-CM

## 2015-10-09 DIAGNOSIS — M5442 Lumbago with sciatica, left side: Secondary | ICD-10-CM

## 2015-10-09 MED ORDER — GABAPENTIN 300 MG PO CAPS: 300.0000 mg | ORAL_CAPSULE | Freq: Two times a day (BID) | ORAL | Status: DC

## 2015-10-09 NOTE — Progress Notes (Signed)
Chief Complaint  Patient presents with  . Migraine    Feels her migraines are well controlled.  She only uses Imitrex for her more severe headaches and it works well to resolve the pain.  . Numbness    Reports numbness and cold sensations are stable with gabapentin.      PATIENT: Jacqueline Lewis DOB: 1961/07/26  HISTORICAL  Jacqueline Lewis is a 55 years old right-handed female, referred by her rheumatologist Dr. Murlean Iba for evaluation of left-sided paresthesia  She had a history of left breast cancer, status post left lobectomy followed by radiation therapy in 2005  In 2015, she noticed intermittent right elbow pain, shoulder pain, sometimes woke up in the middle of the sleep, feeling coldness sensation involving her whole right arm, sometimes traveling down to her right leg,  Since September 2015, she also noticed intermittent left side numbness, tingling, droopiness involving left face, arm, leg,  She also complains intermittent blurry vision, urinary urgency, multiple joints pain, frequent headaches, insomnia, taking trazodone for sleep  Recent laboratory in March 2016 showed normal CBC, CMP, TSH  UPDATE Apr 11 2015: Chief Complaint  Patient presents with  . Migraine    Feels her migraines are well controlled.  She only uses Imitrex for her more severe headaches and it works well to resolve the pain.  . Numbness    Reports numbness and cold sensations are stable with gabapentin.    Electrodiagnostic study in May 2016 was normal, there was no evidence of right upper extremity neuropathy or right cervical radiculopathy.  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 Codeine Nausea Only    HOME MEDICATIONS: Current Outpatient Prescriptions  Medication Sig Dispense Refill  . ALPRAZolam (XANAX) 0.5 MG tablet 0.5 mg daily as needed.   2  . aspirin 81 MG tablet Take 81 mg by mouth daily.    . Cholecalciferol (VITAMIN D) 2000 UNITS CAPS Take by mouth daily.    . clonazePAM (KLONOPIN) 1 MG tablet 1/2 - 1 tab by mouth twice per day as needed 60 tablet 5  . cyclobenzaprine (FLEXERIL) 10 MG tablet Take 10 mg by mouth 3 (three) times daily as needed for muscle spasms.    Marland Kitchen HYDROCODONE-ACETAMINOPHEN PO Take 5-325 mg by mouth 3 (three) times daily as needed.     . meloxicam (MOBIC) 7.5 MG tablet 1 tab by mouth twice per day as needed for pain 60 tablet 3  . Probiotic Product (PROBIOTIC PO) Take by mouth.    . SUMAtriptan (IMITREX) 100 MG tablet Take 1 tablet (100 mg total) by mouth every 2 (two) hours as needed for migraine. May repeat in 2 hours if headache persists or recurs., limit 10 tabs per month 30 tablet 1  . traZODone (  DESYREL) 50 MG tablet TAKE 2 TABLETS BY MOUTH AT BEDTIME WHEN NECESSARY FOR INSOMNIA 180 tablet 1     PAST MEDICAL HISTORY: Past Medical History  Diagnosis Date  . Cervical dysplasia     young age, no treatment  . Personal history of tobacco use, presenting hazards to health   . Special screening for malignant neoplasms, colon 2013  . Carcinoma in situ of breast 2005  . Obesity, unspecified   . IBS (irritable  bowel syndrome) 2005  . Arthritis   . Meningitis Oct 2014  . 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   . Basal cell cancer 2010    Left upper arm  . Osteoarthritis   . Migraines     PAST SURGICAL HISTORY: Past Surgical History  Procedure Laterality Date  . Oophorectomy      BSO  . Lipoma excision    . Abdominal hysterectomy  2006    TAH BSO  . Colonoscopy  2008    [name]  . Laparoscopy  2005  . Nasal septum surgery  may 2014  . Lumbar puncture  Oct 2014  . Breast surgery Left September 2005    wide local excision, 6:00 position.  . Breast biopsy  2005  . Skin cancer excision      Left arm  . Lasik Bilateral   . Mohs surgery      FAMILY HISTORY: Family History  Problem Relation Age of Onset  . Hypertension Mother   . Hyperlipidemia Mother   . Hypertension Father   . Cancer Father     Multiple Myloma  . Breast cancer Maternal Aunt 90  . Diabetes Maternal Aunt     SOCIAL HISTORY:  Social History   Social History  . Marital Status: Married    Spouse Name: N/A  . Number of Children: 0  . Years of Education: 13.5   Occupational History  . Program Communication Specialist    Social History Main Topics  . Smoking status: Current Some Day Smoker -- 0.25 packs/day    Types: Cigarettes  . Smokeless tobacco: Not on file  . Alcohol Use: 0.0 oz/week    0 Standard drinks or equivalent per week     Comment: Rare  . Drug Use: No  . Sexual Activity: No     Comment: HYST-Pt refused sexual hx questions   Other Topics Concern  . Not on file   Social History Narrative   Lives at home with husband and mother.   Right-handed.   4-6 cups/caffeine    PHYSICAL EXAM   Filed Vitals:   10/09/15 1651  Height: 5\' 6"  (1.676 m)  Weight: 179 lb (81.194 kg)    Not recorded      Body mass index is 28.91 kg/(m^2).  PHYSICAL EXAMNIATION:  Gen: NAD, conversant, well nourised, obese, well groomed                      Cardiovascular: Regular rate rhythm, no peripheral edema, warm, nontender. Eyes: Conjunctivae clear without exudates or hemorrhage Neck: Supple, no carotid bruise. Pulmonary: Clear to auscultation bilaterally   NEUROLOGICAL EXAM:  MENTAL STATUS: Speech:    Speech is normal; fluent and spontaneous with normal comprehension.  Cognition:    The patient is oriented to person, place, and time;     recent and remote memory intact;     language fluent;     normal  attention, concentration,     fund of knowledge.  CRANIAL NERVES: CN II: Visual fields are full to confrontation. Fundoscopic exam is normal with sharp discs and no vascular changes. Venous pulsations are present bilaterally. Pupils are 4 mm and briskly reactive to light. Visual acuity is 20/20 bilaterally. CN III, IV, VI: extraocular movement are normal. No ptosis. CN V: Facial sensation is intact to pinprick in all 3 divisions bilaterally. Corneal responses are intact.  CN VII: Face is symmetric with normal eye closure and smile. CN VIII: Hearing is normal to rubbing fingers CN IX, X: Palate elevates symmetrically. Phonation is normal. CN XI: Head turning and shoulder shrug are intact CN XII: Tongue is midline with normal movements and no atrophy.  MOTOR: There is no pronator drift of out-stretched arms. Muscle bulk and tone are normal. Muscle strength is normal.  REFLEXES: Reflexes are 2+ and symmetric at the biceps, triceps, knees, and ankles. Plantar responses are flexor.  SENSORY: Light touch, pinprick, position sense, and vibration sense are intact in fingers and toes.  COORDINATION: Rapid alternating movements and fine finger movements are intact. There is no dysmetria on finger-to-nose and heel-knee-shin. There are no abnormal or extraneous movements.   GAIT/STANCE: Posture is normal. Gait is steady with normal steps, base, arm swing, and turning. Heel and toe walking are normal. Tandem gait is normal.    Romberg is absent.   DIAGNOSTIC DATA (LABS, IMAGING, TESTING) - I reviewed patient records, labs, notes, testing and imaging myself where available.  Lab Results  Component Value Date   WBC 6.7 04/11/2015   HGB 14.9 10/10/2014   HCT 42.3 04/11/2015   MCV 91 04/11/2015   PLT 300 04/11/2015      Component Value Date/Time   NA 140 10/10/2014 0953   NA 137 05/18/2013 0738   K 4.1 10/10/2014 0953   K 3.3* 05/18/2013 0738   CL 104 10/10/2014 0953   CL 103 05/18/2013 0738   CO2 30 10/10/2014 0953   CO2 29 05/18/2013 0738   GLUCOSE 94 10/10/2014 0953   GLUCOSE 96 05/18/2013 0738   BUN 11 10/10/2014 0953   BUN 7 05/18/2013 0738   CREATININE 0.87 10/10/2014 0953   CREATININE 0.94 10/19/2013 1640   CREATININE 0.81 05/18/2013 0738   CALCIUM 9.7 10/10/2014 0953   CALCIUM 9.0 05/18/2013 0738   PROT 7.3 10/10/2014 0953   PROT 7.4 05/16/2013 2135   ALBUMIN 4.3 10/10/2014 0953   ALBUMIN 3.8 05/16/2013 2135   AST 16 10/10/2014 0953   AST 27 05/16/2013 2135   ALT 19 10/10/2014 0953   ALT 20 05/16/2013 2135   ALKPHOS 78 10/10/2014 0953   ALKPHOS 84 05/16/2013 2135   BILITOT 0.3 10/10/2014 0953   BILITOT 0.3 05/16/2013 2135   GFRNONAA >60 05/18/2013 0738   GFRNONAA 81.28 11/16/2008 0901   GFRAA >60 05/18/2013 0738   Lab Results  Component Value Date   CHOL 204* 10/10/2014   HDL 81.30 10/10/2014   LDLCALC 106* 10/10/2014   TRIG 85.0 10/10/2014   CHOLHDL 3 10/10/2014   No results found for: HGBA1C Lab Results  Component Value Date   VITAMINB12 468 04/11/2015   Lab Results  Component Value Date   TSH 0.94 10/10/2014      ASSESSMENT AND PLAN  Jacqueline Lewis is a 55 y.o. female was constellation of complaints, including right side paresthesia, joints pain, left side decreased sensation, blurry vision, memory trouble,   Migraine headaches  Responded well to Imitrex as  needed  Right sided paresthesia  Continue gabapentin     Marcial Pacas, M.D. Ph.D.  [facility] [address] [place], [place] [address] Ph: [phone] Fax: [phone]  CC: Biagio Borg, MD

## 2015-10-18 ENCOUNTER — Telehealth: Payer: Self-pay | Admitting: *Deleted

## 2015-10-18 ENCOUNTER — Other Ambulatory Visit: Payer: Self-pay | Admitting: General Surgery

## 2015-10-18 NOTE — Telephone Encounter (Signed)
Patient canceled her mammogram in January 2017 at [facility], she doesn't want to go there. She wants to see if you can get her a mammogram set up at [facility]. She needs late afternoon for both mammogram and office visit.

## 2015-10-21 ENCOUNTER — Other Ambulatory Visit: Payer: Self-pay | Admitting: *Deleted

## 2015-10-21 DIAGNOSIS — Z1231 Encounter for screening mammogram for malignant neoplasm of breast: Secondary | ICD-10-CM

## 2015-10-21 NOTE — Telephone Encounter (Signed)
Order put in for mammograms to be completed at [facility]. Will contact patient with appointment details once scheduled.

## 2015-10-22 ENCOUNTER — Encounter: Payer: Self-pay | Admitting: *Deleted

## 2015-10-22 NOTE — Telephone Encounter (Signed)
Patient given mammogram appointments and follow up appointments and letter mailed.

## 2015-11-01 ENCOUNTER — Ambulatory Visit
Admission: RE | Admit: 2015-11-01 | Discharge: 2015-11-01 | Disposition: A | Discharge status: Home / Self Care | Payer: 59 | Source: Ambulatory Visit | Attending: General Surgery | Admitting: General Surgery

## 2015-11-01 DIAGNOSIS — Z1231 Encounter for screening mammogram for malignant neoplasm of breast: Secondary | ICD-10-CM | POA: Diagnosis not present

## 2015-11-07 ENCOUNTER — Ambulatory Visit: Payer: 59 | Admitting: General Surgery

## 2015-11-11 ENCOUNTER — Other Ambulatory Visit: Payer: Self-pay | Admitting: Internal Medicine

## 2015-11-13 ENCOUNTER — Other Ambulatory Visit: Payer: Self-pay | Admitting: Internal Medicine

## 2015-11-13 NOTE — Telephone Encounter (Signed)
Done erx to [facility]

## 2015-11-13 NOTE — Telephone Encounter (Signed)
Please advise can we refill

## 2015-11-14 ENCOUNTER — Ambulatory Visit (INDEPENDENT_AMBULATORY_CARE_PROVIDER_SITE_OTHER): Payer: 59 | Admitting: General Surgery

## 2015-11-14 VITALS — BP 134/72 | HR 74 | Resp 12 | Ht 64.0 in | Wt 180.0 lb

## 2015-11-14 DIAGNOSIS — Z853 Personal history of malignant neoplasm of breast: Secondary | ICD-10-CM | POA: Diagnosis not present

## 2015-11-14 NOTE — Progress Notes (Signed)
Patient ID: Jacqueline Lewis, female   DOB: 06/03/1961, 55 y.o.   MRN: [id number]  Chief Complaint  Patient presents with  . Follow-up    mammogram    HPI Jacqueline Lewis is a 55 y.o. female who presents for a breast evaluation. The most recent mammogram was done on 11/01/15.  Patient does perform regular self breast checks and gets regular mammograms done.    I personally reviewed the patient's history.  HPI  Past Medical History  Diagnosis Date  . Cervical dysplasia     young age, no treatment  . Personal history of tobacco use, presenting hazards to health   . Special screening for malignant neoplasms, colon 2013  . Carcinoma in situ of breast 2005  . Obesity, unspecified   . IBS (irritable bowel syndrome) 2005  . Arthritis   . Meningitis Oct 2014  . Cancer 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   . Basal cell cancer 2010    Left upper arm  . Osteoarthritis   . Migraines     Past Surgical History  Procedure Laterality Date  . Oophorectomy      BSO  . Lipoma excision    . Abdominal hysterectomy  2006    TAH BSO  . Colonoscopy  2008    [name]  . Laparoscopy  2005  . Nasal septum surgery  may 2014  . Lumbar puncture  Oct 2014  . Breast surgery Left September 2005    wide local excision, 6:00 position.  . Skin cancer excision      Left arm  . Lasik Bilateral   . Mohs surgery    . Breast biopsy  2005    lumpectomy  . Breast lumpectomy Left 2005    Family History  Problem Relation Age of Onset  . Hypertension Mother   . Hyperlipidemia Mother   . Hypertension Father   . Cancer Father     Multiple Myloma  . Breast cancer Maternal Aunt 90  . Diabetes Maternal Aunt     Social History Social History  Substance Use Topics  . Smoking status: Current Some Day Smoker -- 0.25 packs/day    Types: Cigarettes  . Smokeless tobacco: Not on file  . Alcohol Use: 0.0 oz/week    0 Standard drinks or equivalent  per week     Comment: Rare    Allergies  Allergen Reactions  . Codeine Nausea Only    Current Outpatient Prescriptions  Medication Sig Dispense Refill  . aspirin 81 MG tablet Take 81 mg by mouth daily.    Marland Kitchen azithromycin (ZITHROMAX) 1 G powder Take 1 packet (1 g total) by mouth once. 1 each 0  . Cholecalciferol (VITAMIN D) 2000 UNITS CAPS Take by mouth daily.    . clonazePAM (KLONOPIN) 1 MG tablet TAKE 1/2 TO 1 TABLET BY MOUTH TWICE DAILY AS NEEDED 60 tablet 5  . cyclobenzaprine (FLEXERIL) 10 MG tablet Take 10 mg by mouth 3 (three) times daily as needed for muscle spasms.    Marland Kitchen gabapentin (NEURONTIN) 300 MG capsule Take 1 capsule (300 mg total) by mouth 2 (two) times daily. 180 capsule 4  . meloxicam (MOBIC) 7.5 MG tablet 1 tab by mouth twice per day as needed for pain 60 tablet 3  . Probiotic Product (PROBIOTIC PO) Take by mouth.    . SUMAtriptan (IMITREX) 100 MG tablet Take 1 tablet (100 mg total) by mouth every  2 (two) hours as needed for migraine. May repeat in 2 hours if headache persists or recurs., limit 10 tabs per month 30 tablet 1  . traMADol (ULTRAM) 50 MG tablet Take 50 mg by mouth 3 (three) times daily as needed.  0  . traZODone (DESYREL) 50 MG tablet TAKE 2 TABLETS BY MOUTH AT BEDTIME WHEN NECESSARY FOR INSOMNIA 180 tablet 1   No current facility-administered medications for this visit.    Review of Systems Review of Systems  Constitutional: Negative.   Respiratory: Negative.   Cardiovascular: Negative.     Blood pressure 134/72, pulse 74, resp. rate 12, height 5\' 4"  (1.626 m), weight 180 lb (81.647 kg).  Physical Exam Physical Exam  Constitutional: She is oriented to person, place, and time. She appears well-developed and well-nourished.  Eyes: Conjunctivae are normal. No scleral icterus.  Neck: Neck supple.  Cardiovascular: Normal rate, regular rhythm and normal heart sounds.   Pulmonary/Chest: Effort normal and breath sounds normal. Right breast exhibits no  inverted nipple, no mass, no nipple discharge, no skin change and no tenderness. Left breast exhibits no inverted nipple, no mass, no nipple discharge, no skin change and no tenderness.  Lymphadenopathy:    She has no cervical adenopathy.    She has no axillary adenopathy.  Neurological: She is alert and oriented to person, place, and time.  Skin: Skin is warm and dry.    Data Reviewed Bilateral mammograms dated 11/01/2015 were reviewed. Postsurgical changes noted. BI-RADS-1  Assessment    Doing well now 12 years status post management of high-grade DCIS of the left breast.    Plan       Patient will be asked to return to the office in one year with a bilateral screening mammogram. PCP:  Cathlean Cower  This information has been scribed by Gaspar Cola CMA.    Robert Bellow 11/15/2015, 5:13 PM

## 2015-11-14 NOTE — Patient Instructions (Signed)
Patient will be asked to return to the office in one year with a bilateral screening mammogram. 

## 2015-12-13 ENCOUNTER — Ambulatory Visit (INDEPENDENT_AMBULATORY_CARE_PROVIDER_SITE_OTHER): Payer: 59 | Admitting: Internal Medicine

## 2015-12-13 ENCOUNTER — Encounter: Payer: Self-pay | Admitting: Internal Medicine

## 2015-12-13 ENCOUNTER — Other Ambulatory Visit (INDEPENDENT_AMBULATORY_CARE_PROVIDER_SITE_OTHER): Payer: 59

## 2015-12-13 VITALS — BP 122/76 | HR 89 | Temp 98.7°F | Resp 20 | Wt 179.0 lb

## 2015-12-13 DIAGNOSIS — R202 Paresthesia of skin: Secondary | ICD-10-CM

## 2015-12-13 DIAGNOSIS — R6889 Other general symptoms and signs: Secondary | ICD-10-CM

## 2015-12-13 DIAGNOSIS — Z0001 Encounter for general adult medical examination with abnormal findings: Secondary | ICD-10-CM

## 2015-12-13 DIAGNOSIS — J309 Allergic rhinitis, unspecified: Secondary | ICD-10-CM | POA: Insufficient documentation

## 2015-12-13 DIAGNOSIS — Z1159 Encounter for screening for other viral diseases: Secondary | ICD-10-CM

## 2015-12-13 DIAGNOSIS — M545 Low back pain, unspecified: Secondary | ICD-10-CM | POA: Insufficient documentation

## 2015-12-13 DIAGNOSIS — G43809 Other migraine, not intractable, without status migrainosus: Secondary | ICD-10-CM

## 2015-12-13 DIAGNOSIS — Z0000 Encounter for general adult medical examination without abnormal findings: Secondary | ICD-10-CM | POA: Diagnosis not present

## 2015-12-13 DIAGNOSIS — G8929 Other chronic pain: Secondary | ICD-10-CM | POA: Insufficient documentation

## 2015-12-13 DIAGNOSIS — F17200 Nicotine dependence, unspecified, uncomplicated: Secondary | ICD-10-CM | POA: Insufficient documentation

## 2015-12-13 DIAGNOSIS — Z720 Tobacco use: Secondary | ICD-10-CM

## 2015-12-13 LAB — BASIC METABOLIC PANEL
BUN: 16 mg/dL (ref 6–23)
CALCIUM: 9.5 mg/dL (ref 8.4–10.5)
CO2: 29 mEq/L (ref 19–32)
Chloride: 104 mEq/L (ref 96–112)
Creatinine, Ser: 0.86 mg/dL (ref 0.40–1.20)
GFR: 72.72 mL/min (ref >60.00)
GLUCOSE: 91 mg/dL (ref 70–99)
Potassium: 4.5 mEq/L (ref 3.5–5.1)
Sodium: 141 mEq/L (ref 135–145)

## 2015-12-13 LAB — URINALYSIS, ROUTINE W REFLEX MICROSCOPIC
BILIRUBIN URINE: NEGATIVE
HGB URINE DIPSTICK: NEGATIVE
KETONES UR: NEGATIVE
LEUKOCYTES UA: NEGATIVE
NITRITE: NEGATIVE
RBC / HPF: NONE SEEN (ref 0–?)
Specific Gravity, Urine: 1.01 (ref 1.000–1.030)
Total Protein, Urine: NEGATIVE
URINE GLUCOSE: NEGATIVE
UROBILINOGEN UA: 0.2 (ref 0.0–1.0)
WBC UA: NONE SEEN (ref 0–?)
pH: 5.5 (ref 5.0–8.0)

## 2015-12-13 LAB — CBC WITH DIFFERENTIAL/PLATELET
Basophils Absolute: 0.1 10*3/uL (ref 0.0–0.1)
Basophils Relative: 0.6 % (ref 0.0–3.0)
EOS PCT: 2.6 % (ref 0.0–5.0)
Eosinophils Absolute: 0.2 10*3/uL (ref 0.0–0.7)
HEMATOCRIT: 42.9 % (ref 36.0–46.0)
HEMOGLOBIN: 14.7 g/dL (ref 12.0–15.0)
LYMPHS PCT: 25.3 % (ref 12.0–46.0)
Lymphs Abs: 2.1 10*3/uL (ref 0.7–4.0)
MCHC: 34.2 g/dL (ref 30.0–36.0)
MCV: 90.2 fl (ref 78.0–100.0)
MONOS PCT: 7.7 % (ref 3.0–12.0)
Monocytes Absolute: 0.6 10*3/uL (ref 0.1–1.0)
Neutro Abs: 5.2 10*3/uL (ref 1.4–7.7)
Neutrophils Relative %: 63.8 % (ref 43.0–77.0)
Platelets: 297 10*3/uL (ref 150.0–400.0)
RBC: 4.76 Mil/uL (ref 3.87–5.11)
RDW: 13.1 % (ref 11.5–15.5)
WBC: 8.2 10*3/uL (ref 4.0–10.5)

## 2015-12-13 LAB — LIPID PANEL
CHOL/HDL RATIO: 3
CHOLESTEROL: 191 mg/dL (ref 0–200)
HDL: 67.2 mg/dL (ref >39.00)
LDL CALC: 110 mg/dL — AB (ref 0–99)
NonHDL: 124.16
TRIGLYCERIDES: 69 mg/dL (ref 0.0–149.0)
VLDL: 13.8 mg/dL (ref 0.0–40.0)

## 2015-12-13 LAB — HEPATIC FUNCTION PANEL
ALBUMIN: 4.3 g/dL (ref 3.5–5.2)
ALT: 15 U/L (ref 0–35)
AST: 14 U/L (ref 0–37)
Alkaline Phosphatase: 76 U/L (ref 39–117)
Bilirubin, Direct: 0 mg/dL (ref 0.0–0.3)
Total Bilirubin: 0.4 mg/dL (ref 0.2–1.2)
Total Protein: 7.1 g/dL (ref 6.0–8.3)

## 2015-12-13 LAB — TSH: TSH: 1 u[IU]/mL (ref 0.35–4.50)

## 2015-12-13 MED ORDER — SUMATRIPTAN SUCCINATE 100 MG PO TABS: 100.0000 mg | ORAL_TABLET | ORAL | Status: DC | PRN

## 2015-12-13 MED ORDER — GABAPENTIN 300 MG PO CAPS: 300.0000 mg | ORAL_CAPSULE | Freq: Three times a day (TID) | ORAL | Status: DC

## 2015-12-13 MED ORDER — HYDROCODONE-ACETAMINOPHEN 5-325 MG PO TABS: 1.0000 | ORAL_TABLET | Freq: Every day | ORAL | Status: DC | PRN

## 2015-12-13 NOTE — Progress Notes (Signed)
Subjective:    Patient ID: Jacqueline Lewis, female    DOB: 11/26/1960, 55 y.o.   MRN: [id number]  HPI  Here for wellness and f/u;  Overall doing ok;  Pt denies Chest pain, worsening SOB, DOE, wheezing, orthopnea, PND, worsening LE edema, palpitations, dizziness or syncope.  Pt denies neurological change such as new headache, facial or extremity weakness except for recurrent migraine without change in severity or freq.  Pt denies polydipsia, polyuria, or low sugar symptoms. Pt states overall good compliance with treatment and medications, good tolerability, and has been trying to follow appropriate diet.  Pt denies worsening depressive symptoms, suicidal ideation or panic. No fever, night sweats, wt loss, loss of appetite, or other constitutional symptoms.  Pt states good ability with ADL's, has low fall risk, home safety reviewed and adequate, no other significant changes in hearing or vision, and only occasionally active with exercise.  Does have several wks ongoing nasal allergy symptoms with clearish congestion, itch and sneezing, without fever, pain, ST, cough, swelling or wheezing.   Gabapentin helps" zingers" and LE discomfort, asks for TID dosing as BID wears off.  Ready to quit smoking, wanting to try the patch, does not want the wellbutrin or chantix. Pt continues to have recurring LBP without change in severity, bowel or bladder change, fever, wt loss,  worsening LE pain/numbness/weakness, gait change or falls.  Past Medical History  Diagnosis Date  . Cervical dysplasia     young age, no treatment  . Personal history of tobacco use, presenting hazards to health   . Special screening for malignant neoplasms, colon 2013  . Carcinoma in situ of breast 2005  . Obesity, unspecified   . IBS (irritable bowel syndrome) 2005  . Arthritis   . Meningitis Oct 2014  . 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   . Basal cell cancer 2010    Left  upper arm  . Osteoarthritis   . Migraines    Past Surgical History  Procedure Laterality Date  . Oophorectomy      BSO  . Lipoma excision    . Abdominal hysterectomy  2006    TAH BSO  . Colonoscopy  2008    [name]  . Laparoscopy  2005  . Nasal septum surgery  may 2014  . Lumbar puncture  Oct 2014  . Breast surgery Left September 2005    wide local excision, 6:00 position.  . Skin cancer excision      Left arm  . Lasik Bilateral   . Mohs surgery    . Breast biopsy  2005    lumpectomy  . Breast lumpectomy Left 2005    reports that she has been smoking Cigarettes.  She has been smoking about 0.25 packs per day. She does not have any smokeless tobacco history on file. She reports that she drinks alcohol. She reports that she does not use illicit drugs. family history includes Breast cancer (age of onset: 81) in her maternal aunt; Cancer in her father; Diabetes in her maternal aunt; Hyperlipidemia in her mother; Hypertension in her father and mother. Allergies  Allergen Reactions  . Codeine Nausea Only   Current Outpatient Prescriptions on File Prior to Visit  Medication Sig Dispense Refill  . aspirin 81 MG tablet Take 81 mg by mouth daily.    . Cholecalciferol (VITAMIN D) 2000 UNITS CAPS Take by mouth daily.    . clonazePAM (KLONOPIN)  1 MG tablet TAKE 1/2 TO 1 TABLET BY MOUTH TWICE DAILY AS NEEDED 60 tablet 5  . cyclobenzaprine (FLEXERIL) 10 MG tablet Take 10 mg by mouth 3 (three) times daily as needed for muscle spasms.    . meloxicam (MOBIC) 7.5 MG tablet 1 tab by mouth twice per day as needed for pain 60 tablet 3  . Probiotic Product (PROBIOTIC PO) Take by mouth.    . traZODone (DESYREL) 50 MG tablet TAKE 2 TABLETS BY MOUTH AT BEDTIME WHEN NECESSARY FOR INSOMNIA 180 tablet 1   No current facility-administered medications on file prior to visit.    Review of Systems Constitutional: Negative for increased diaphoresis, or other activity, appetite or siginficant weight  change other than noted HENT: Negative for worsening hearing loss, ear pain, facial swelling, mouth sores and neck stiffness.   Eyes: Negative for other worsening pain, redness or visual disturbance.  Respiratory: Negative for choking or stridor Cardiovascular: Negative for other chest pain and palpitations.  Gastrointestinal: Negative for worsening diarrhea, blood in stool, or abdominal distention Genitourinary: Negative for hematuria, flank pain or change in urine volume.  Musculoskeletal: Negative for myalgias or other joint complaints.  Skin: Negative for other color change and wound or drainage.  Neurological: Negative for syncope and numbness. other than noted Hematological: Negative for adenopathy. or other swelling Psychiatric/Behavioral: Negative for hallucinations, SI, self-injury, decreased concentration or other worsening agitation.      Objective:   Physical Exam BP 122/76 mmHg  Pulse 89  Temp(Src) 98.7 F (37.1 C) (Oral)  Resp 20  Wt 179 lb (81.194 kg)  SpO2 94% VS noted,  Constitutional: Pt is oriented to person, place, and time. Appears well-developed and well-nourished, in no significant distress Head: Normocephalic and atraumatic  Eyes: Conjunctivae and EOM are normal. Pupils are equal, round, and reactive to light Right Ear: External ear normal.  Left Ear: External ear normal Nose: Nose normal.  Mouth/Throat: Oropharynx is clear and moist  Bilat tm's with mild erythema.  Max sinus areas non tender.  Pharynx with mild erythema, no exudate Neck: Normal range of motion. Neck supple. No JVD present. No tracheal deviation present or significant neck LA or mass Cardiovascular: Normal rate, regular rhythm, normal heart sounds and intact distal pulses.  Pulmonary/Chest: Effort normal and breath sounds without rales or wheezing  Abdominal: Soft. Bowel sounds are normal. NT. No HSM  Musculoskeletal: Normal range of motion. Exhibits no edema Lymphadenopathy: Has no  cervical adenopathy.  Neurological: Pt is alert and oriented to person, place, and time. Pt has normal reflexes. No cranial nerve deficit. Motor grossly intact Skin: Skin is warm and dry. No rash noted or new ulcers Psychiatric:  Has normal mood and affect. Behavior is normal.      Assessment & Plan:

## 2015-12-13 NOTE — Patient Instructions (Addendum)
Please take all new medication as prescribed  - the hydrocodone which should last you 1 year, using sparingly  OK to increase the gabapentin to three times daily  Ok to try the OTC Zyrtec (or allegra) for allergies  Please continue all other medications as before, and refills have been done if requested - the imitrex  Please have the pharmacy call with any other refills you may need.  Please continue your efforts at being more active, low cholesterol diet, and weight control.  You are otherwise up to date with prevention measures today.  Please keep your appointments with your specialists as you may have planned  Please quit smoking as you have planned  Please go to the LAB in the Basement (turn left off the elevator) for the tests to be done today  You will be contacted by phone if any changes need to be made immediately.  Otherwise, you will receive a letter about your results with an explanation, but please check with MyChart first.  Please remember to sign up for MyChart if you have not done so, as this will be important to you in the future with finding out test results, communicating by private email, and scheduling acute appointments online when needed.  Please return in 1 year for your yearly visit, or sooner if needed, with Lab testing done 3-5 days before

## 2015-12-13 NOTE — Progress Notes (Signed)
Pre visit review using our clinic review tool, if applicable. No additional management support is needed unless otherwise documented below in the visit note. 

## 2015-12-14 LAB — HEPATITIS C ANTIBODY: HCV AB: NEGATIVE

## 2015-12-14 NOTE — Assessment & Plan Note (Signed)

## 2015-12-14 NOTE — Assessment & Plan Note (Signed)
stable overall by history and exam, and pt to continue medical treatment as before - for hydrocodone prn sparingly use,,  to f/u any worsening symptoms or concerns

## 2015-12-14 NOTE — Assessment & Plan Note (Signed)
Urged to quit,  to f/u any worsening symptoms or concerns  

## 2015-12-14 NOTE — Assessment & Plan Note (Addendum)
Improved but with sujective breakthrough, for increased gabapentin tid,  to f/u any worsening symptoms or concerns  In addition to the time spent performing CPE, I spent an additional 40 minutes face to face,in which greater than 50% of this time was spent in counseling and coordination of care for patient's acute illness as documented.

## 2015-12-14 NOTE — Assessment & Plan Note (Signed)
stable overall by history and exam, and pt to continue medical treatment as before,  to f/u any worsening symptoms or concerns;e

## 2015-12-14 NOTE — Assessment & Plan Note (Signed)
Mild to mod, for OTC zyrtec/nasacort asd,,  to f/u any worsening symptoms or concerns 

## 2015-12-31 ENCOUNTER — Other Ambulatory Visit: Payer: Self-pay | Admitting: Internal Medicine

## 2016-01-01 NOTE — Telephone Encounter (Signed)
Refill sent to pharmacy.   

## 2016-01-01 NOTE — Telephone Encounter (Signed)
Done hardcopy to Corinne  

## 2016-01-01 NOTE — Telephone Encounter (Signed)
Please advise 

## 2016-02-26 ENCOUNTER — Encounter: Payer: Self-pay | Admitting: Gastroenterology

## 2016-04-28 ENCOUNTER — Ambulatory Visit (INDEPENDENT_AMBULATORY_CARE_PROVIDER_SITE_OTHER): Payer: 59 | Admitting: Gastroenterology

## 2016-04-28 ENCOUNTER — Encounter: Payer: Self-pay | Admitting: Gastroenterology

## 2016-04-28 VITALS — BP 116/84 | HR 88 | Ht 64.17 in | Wt 179.4 lb

## 2016-04-28 DIAGNOSIS — R1032 Left lower quadrant pain: Secondary | ICD-10-CM | POA: Diagnosis not present

## 2016-04-28 DIAGNOSIS — K5730 Diverticulosis of large intestine without perforation or abscess without bleeding: Secondary | ICD-10-CM

## 2016-04-28 MED ORDER — CIPROFLOXACIN HCL 500 MG PO TABS: 500.0000 mg | ORAL_TABLET | Freq: Two times a day (BID) | ORAL | 0 refills | Status: AC

## 2016-04-28 MED ORDER — METRONIDAZOLE 500 MG PO TABS: 500.0000 mg | ORAL_TABLET | Freq: Three times a day (TID) | ORAL | 0 refills | Status: AC

## 2016-04-28 MED ORDER — NA SULFATE-K SULFATE-MG SULF 17.5-3.13-1.6 GM/177ML PO SOLN: 1.0000 | Freq: Once | ORAL | 0 refills | Status: AC

## 2016-04-28 NOTE — Progress Notes (Signed)
[facility] Consult Note:  History: Jacqueline Lewis 04/28/2016  Referring physician: Cathlean Cower, MD  Reason for consult/chief complaint: Abdominal Pain (LLQ pain, none now, to get established)   Subjective  HPI:  Mother was referred to see me for left lower quadrant pain. She previously saw Dr. Olevia Lewis for many years for her IBS. This would give some intermittent loose stool, but really have been required in recent years. About 2 months ago she had at least 2 weeks of left lower quadrant pain. It was of subacute onset, nonradiating, and felt both dull and sharp, with no change in bowel habits. She had not had anything like it before, and it did not feel like her typical IBS symptoms. It then was fairly constant for about 2 weeks until it finally subsided. It now only hurts resting deeply on the left lower quadrant. He denies rectal bleeding. Jacqueline Lewis became increasingly concerned because a close friend recently died from metastatic colon cancer.  ROS:  Review of Systems  Constitutional: Negative for appetite change and unexpected weight change.  HENT: Negative for mouth sores and voice change.   Eyes: Negative for pain and redness.  Respiratory: Negative for cough and shortness of breath.   Cardiovascular: Negative for chest pain and palpitations.  Genitourinary: Negative for dysuria and hematuria.  Musculoskeletal: Negative for arthralgias and myalgias.  Skin: Negative for pallor and rash.  Neurological: Negative for weakness and headaches.  Hematological: Negative for adenopathy.     Past Medical History: Past Medical History:  Diagnosis Date  . Arthritis   . Basal cell cancer 2010   Left upper arm  . Carcinoma in situ of breast 2005  . Cervical dysplasia    young age, no treatment  . IBS (irritable bowel syndrome) 2005  . Meningitis Oct 2014  . Migraines   . Obesity, unspecified   . Osteoarthritis   . Personal history of tobacco use, presenting hazards to  health   . Special screening for malignant neoplasms, colon 2013  . Squamous cell carcinoma [facility])      Past Surgical History: Past Surgical History:  Procedure Laterality Date  . ABDOMINAL HYSTERECTOMY  2006   TAH BSO  . BREAST LUMPECTOMY W/ NEEDLE LOCALIZATION Left 04/2004  . COLONOSCOPY  2008   [name]  . LASIK Bilateral   . LIPOMA EXCISION    . LUMBAR PUNCTURE  Oct 2014  . MOHS SURGERY    . NASAL SEPTUM SURGERY  may 2014  . SKIN CANCER EXCISION     Left arm     Family History: Family History  Problem Relation Age of Onset  . Hypertension Mother   . Hyperlipidemia Mother   . Hypertension Father   . Multiple myeloma Father   . Breast cancer Maternal Aunt 90  . Diabetes Maternal Aunt     Social History: Social History   Social History  . Marital status: Married    Spouse name: N/A  . Number of children: 0  . Years of education: 13.5   Occupational History  . Program Communication Specialist    Social History Main Topics  . Smoking status: Current Some Day Smoker    Packs/day: 0.25    Types: Cigarettes  . Smokeless tobacco: Never Used  . Alcohol use 0.0 oz/week     Comment: Rare  . Drug use: No  . Sexual activity: No     Comment: HYST-Pt refused sexual hx questions   Other Topics Concern  . None  Social History Narrative   Lives at home with husband and mother.   Right-handed.   4-6 cups/caffeine    Allergies: Allergies  Allergen Reactions  . Codeine Nausea Only    Outpatient Meds: Current Outpatient Prescriptions  Medication Sig Dispense Refill  . ALPRAZolam (XANAX) 0.5 MG tablet Take 0.5 mg by mouth daily as needed for anxiety.    Marland Kitchen aspirin 81 MG tablet Take 81 mg by mouth daily.    . Biotin 5000 MCG TABS Take 1 tablet by mouth daily.    . Cholecalciferol (VITAMIN D) 2000 UNITS CAPS Take by mouth daily.    . clonazePAM (KLONOPIN) 1 MG tablet TAKE 0.5-1 TABLET BY MOUTH TWICE A DAY AS NEEDED 60 tablet 5  . fexofenadine (ALLEGRA) 180 MG  tablet Take 180 mg by mouth as needed for allergies or rhinitis.    Marland Kitchen gabapentin (NEURONTIN) 300 MG capsule Take 1 capsule (300 mg total) by mouth 3 (three) times daily. 270 capsule 3  . HYDROcodone-acetaminophen (NORCO/VICODIN) 5-325 MG tablet Take 1 tablet by mouth daily as needed for moderate pain. 90 tablet 0  . meloxicam (MOBIC) 7.5 MG tablet 1 tab by mouth twice per day as needed for pain (Patient taking differently: Take 7.5 mg by mouth as needed. ) 60 tablet 3  . Probiotic Product (PROBIOTIC PO) Take by mouth.    . SUMAtriptan (IMITREX) 100 MG tablet Take 1 tablet (100 mg total) by mouth every 2 (two) hours as needed for migraine. May repeat in 2 hours if headache persists or recurs., limit 10 tabs per month 30 tablet 3  . traZODone (DESYREL) 50 MG tablet TAKE 2 TABLETS BY MOUTH AT BEDTIME WHEN NECESSARY FOR INSOMNIA 180 tablet 1  . ciprofloxacin (CIPRO) 500 MG tablet Take 1 tablet (500 mg total) by mouth 2 (two) times daily. 14 tablet 0  . cyclobenzaprine (FLEXERIL) 10 MG tablet Take 10 mg by mouth 3 (three) times daily as needed for muscle spasms.    . metroNIDAZOLE (FLAGYL) 500 MG tablet Take 1 tablet (500 mg total) by mouth 3 (three) times daily. 21 tablet 0  . Na Sulfate-K Sulfate-Mg Sulf 17.5-3.13-1.6 GM/180ML SOLN Take 1 kit by mouth once. 354 mL 0   No current facility-administered medications for this visit.       ___________________________________________________________________ Objective   Exam:  BP 116/84 (BP Location: Left Arm, Patient Position: Sitting, Cuff Size: Normal)   Pulse 88   Ht 5' 4.17" (1.63 m) Comment: height measured without shoes  Wt 179 lb 6 oz (81.4 kg)   BMI 30.62 kg/m    General: this is a(n) Well-appearing middle-aged woman   Eyes: sclera anicteric, no redness  ENT: oral mucosa moist without lesions, no cervical or supraclavicular lymphadenopathy, good dentition  CV: RRR without murmur, S1/S2, no JVD, no peripheral edema  Resp: clear to  auscultation bilaterally, normal RR and effort noted  GI: soft, mild LLQ tenderness to deep palpation, with active bowel sounds. No guarding or palpable organomegaly noted.  Skin; warm and dry, no rash or jaundice noted  Neuro: awake, alert and oriented x 3. Normal gross motor function and fluent speech  Assessment: Encounter Diagnoses  Name Primary?  Marland Kitchen LLQ abdominal pain Yes  . Diverticulosis of colon without hemorrhage     It is difficult to tell the source of the pain. It seems unlikely she would have smoldering diverticulitis all this time, or that that should improve on its own without antibiotics. He does not have red  flag symptoms such as change in bowel habits, weight loss or rectal bleeding. Nevertheless, this is a change for her very concerned after the recent death of her friend. Jacqueline Lewis's last colonoscopy was in 2008, showing only left-sided diverticulosis.  Plan:  Empiric treatment for possible diverticulitis, 7 days of ciprofloxacin and Flagyl.  Colonoscopy  The benefits and risks of the planned procedure were described in detail with the patient or (when appropriate) their health care proxy.  Risks were outlined as including, but not limited to, bleeding, infection, perforation, adverse medication reaction leading to cardiac or pulmonary decompensation, or pancreatitis (if ERCP).  The limitation of incomplete mucosal visualization was also discussed.  No guarantees or warranties were given.   Thank you for the courtesy of this consult.  Please call me with any questions or concerns.  Nelida Meuse III  CC: Jacqueline Cower, MD

## 2016-04-28 NOTE — Patient Instructions (Signed)
If you are age 55 or older, your body mass index should be between 23-30. Your Body mass index is 30.62 kg/m. If this is out of the aforementioned range listed, please consider follow up with your Primary Care Provider.  If you are age 37 or younger, your body mass index should be between 19-25. Your Body mass index is 30.62 kg/m. If this is out of the aformentioned range listed, please consider follow up with your Primary Care Provider.   You have been scheduled for a colonoscopy. Please follow written instructions given to you at your visit today.  Please pick up your prep supplies at the pharmacy within the next 1-3 days. If you use inhalers (even only as needed), please bring them with you on the day of your procedure. Your physician has requested that you go to www.startemmi.com and enter the access code given to you at your visit today. This web site gives a general overview about your procedure. However, you should still follow specific instructions given to you by our office regarding your preparation for the procedure.  We have sent the following medications to your pharmacy for you to pick up at your convenience: Suprep  Thank you for choosing [facility]  Dr Wilfrid Lund III

## 2016-05-11 ENCOUNTER — Other Ambulatory Visit: Payer: Self-pay | Admitting: Internal Medicine

## 2016-05-13 ENCOUNTER — Encounter: Payer: 59 | Admitting: Gastroenterology

## 2016-05-29 ENCOUNTER — Encounter: Payer: Self-pay | Admitting: Gastroenterology

## 2016-06-04 ENCOUNTER — Ambulatory Visit (INDEPENDENT_AMBULATORY_CARE_PROVIDER_SITE_OTHER): Payer: 59 | Admitting: Family

## 2016-06-04 ENCOUNTER — Ambulatory Visit (INDEPENDENT_AMBULATORY_CARE_PROVIDER_SITE_OTHER)
Admission: RE | Admit: 2016-06-04 | Discharge: 2016-06-04 | Disposition: A | Discharge status: Home / Self Care | Payer: 59 | Source: Ambulatory Visit | Attending: Family | Admitting: Family

## 2016-06-04 ENCOUNTER — Encounter: Payer: Self-pay | Admitting: Family

## 2016-06-04 ENCOUNTER — Telehealth: Payer: Self-pay | Admitting: Gastroenterology

## 2016-06-04 ENCOUNTER — Telehealth: Payer: Self-pay | Admitting: Family

## 2016-06-04 VITALS — BP 128/9 | HR 83 | Temp 98.5°F | Resp 18 | Ht 64.17 in | Wt 175.0 lb

## 2016-06-04 DIAGNOSIS — R05 Cough: Secondary | ICD-10-CM | POA: Diagnosis not present

## 2016-06-04 DIAGNOSIS — R059 Cough, unspecified: Secondary | ICD-10-CM

## 2016-06-04 NOTE — Telephone Encounter (Signed)
Patient called back in.  I gave her x ray results.

## 2016-06-04 NOTE — Progress Notes (Signed)
Subjective:    Patient ID: Jacqueline Lewis, female    DOB: 06/21/1961, 55 y.o.   MRN: [id number]  Chief Complaint  Patient presents with  . Cough    x4 weeks, chest congestion, cough, chest tightness    HPI:  Jacqueline Lewis is a 55 y.o. female who  has a past medical history of Arthritis; Basal cell cancer (2010); Carcinoma in situ of breast (2005); Cervical dysplasia; IBS (irritable bowel syndrome) (2005); Meningitis (Oct 2014); Migraines; Obesity, unspecified; Osteoarthritis; Personal history of tobacco use, presenting hazards to health; Special screening for malignant neoplasms, colon (2013); and Squamous cell carcinoma. and presents today for an acute office visit.   This is a new problem.  Associated symptoms of chest congestion, chest tightness, and cough have been going on for approximately 3 weeks. Over the course of the last 3 weeks she has had some improvements, however the current symptoms have seemed to linger. Denies fevers. Modifying factors include taking 7-8 doses of Keflex from a previous prescription.   Allergies  Allergen Reactions  . Codeine Nausea Only      Outpatient Medications Prior to Visit  Medication Sig Dispense Refill  . aspirin 81 MG tablet Take 81 mg by mouth daily.    . Biotin 5000 MCG TABS Take 1 tablet by mouth daily.    . Cholecalciferol (VITAMIN D) 2000 UNITS CAPS Take by mouth daily.    . clonazePAM (KLONOPIN) 1 MG tablet TAKE 0.5-1 TABLET BY MOUTH TWICE A DAY AS NEEDED 60 tablet 5  . cyclobenzaprine (FLEXERIL) 10 MG tablet Take 10 mg by mouth 3 (three) times daily as needed for muscle spasms.    . fexofenadine (ALLEGRA) 180 MG tablet Take 180 mg by mouth as needed for allergies or rhinitis.    Marland Kitchen gabapentin (NEURONTIN) 300 MG capsule Take 1 capsule (300 mg total) by mouth 3 (three) times daily. 270 capsule 3  . HYDROcodone-acetaminophen (NORCO/VICODIN) 5-325 MG tablet Take 1 tablet by mouth daily as needed for moderate pain. 90 tablet 0  .  meloxicam (MOBIC) 7.5 MG tablet 1 tab by mouth twice per day as needed for pain (Patient taking differently: Take 7.5 mg by mouth as needed. ) 60 tablet 3  . Probiotic Product (PROBIOTIC PO) Take by mouth.    . SUMAtriptan (IMITREX) 100 MG tablet Take 1 tablet (100 mg total) by mouth every 2 (two) hours as needed for migraine. May repeat in 2 hours if headache persists or recurs., limit 10 tabs per month 30 tablet 3  . traZODone (DESYREL) 50 MG tablet TAKE 2 TABLETS BY MOUTH AT BEDTIME WHEN NECESSARY FOR INSOMNIA 180 tablet 1  . ALPRAZolam (XANAX) 0.5 MG tablet Take 0.5 mg by mouth daily as needed for anxiety.     No facility-administered medications prior to visit.      Review of Systems  Constitutional: Negative for chills and fever.  HENT: Positive for congestion. Negative for sinus pressure and sore throat.   Respiratory: Positive for cough, chest tightness and shortness of breath. Negative for wheezing.       Objective:    BP (!) 128/9 (BP Location: Left Arm, Patient Position: Sitting, Cuff Size: Normal)   Pulse 83   Temp 98.5 F (36.9 C) (Oral)   Resp 18   Ht 5' 4.17" (1.63 m)   Wt 175 lb (79.4 kg)   SpO2 98%   BMI 29.88 kg/m  Nursing note and vital signs reviewed.  Physical Exam  Constitutional: She  is oriented to person, place, and time. She appears well-developed and well-nourished. No distress.  HENT:  Right Ear: Hearing, tympanic membrane, external ear and ear canal normal.  Left Ear: Hearing, tympanic membrane, external ear and ear canal normal.  Nose: Nose normal.  Mouth/Throat: Uvula is midline, oropharynx is clear and moist and mucous membranes are normal.  Cardiovascular: Normal rate, regular rhythm, normal heart sounds and intact distal pulses.   Pulmonary/Chest: Effort normal. No respiratory distress. She has wheezes (occasional). She has no rales. She exhibits no tenderness.  Neurological: She is alert and oriented to person, place, and time.  Skin: Skin is  warm and dry.  Psychiatric: She has a normal mood and affect. Her behavior is normal. Judgment and thought content normal.       Assessment & Plan:   Problem List Items Addressed This Visit      Other   Cough - Primary    Symptoms and exam consistent with resolving bronchitis. Obtain x-ray to rule out underlying pneumonia although unlikely. Treat conservatively with over-the-counter medications as needed for symptom relief and supportive care. Follow-up if symptoms worsen or do not improve.      Relevant Orders   DG Chest 2 View    Other Visit Diagnoses   None.      I have discontinued Ms. Prinsen's ALPRAZolam. I am also having her maintain her Probiotic Product (PROBIOTIC PO), aspirin, cyclobenzaprine, Vitamin D, meloxicam, gabapentin, SUMAtriptan, HYDROcodone-acetaminophen, clonazePAM, fexofenadine, Biotin, traZODone, and topiramate.   Meds ordered this encounter  Medications  . topiramate (TOPAMAX) 50 MG tablet    Sig: Take 50 mg by mouth 2 (two) times daily.     Follow-up: Return if symptoms worsen or fail to improve.  Mauricio Po, FNP

## 2016-06-04 NOTE — Telephone Encounter (Signed)
Patient called she has been diagnosed with bronchitis today. PCP put her on Symbicort. Patient denies fever, just tight cough. Patient is scheduled for colonoscopy on 11/10 and wants to know if she should reschedule. She will be at the 4 week mark on the 10th. Please advise.

## 2016-06-04 NOTE — Patient Instructions (Addendum)
Thank you for choosing [facility].  SUMMARY AND INSTRUCTIONS:  Medication:  Start Symbicort 1-2 puffs twice daily for wheezing and inflammation.  Your prescription(s) have been submitted to your pharmacy or been printed and provided for you. Please take as directed and contact our office if you believe you are having problem(s) with the medication(s) or have any questions.   Imaging / Radiology:  Please stop by radiology on the basement level of the building for your x-rays. Your results will be released to [place] (or called to you) after review, usually within 72 hours after test completion. If any treatments or changes are necessary, you will be notified at that same time.  Follow up:  If your symptoms worsen or fail to improve, please contact our office for further instruction, or in case of emergency go directly to the emergency room at the closest medical facility.   General Recommendations:    Please drink plenty of fluids.  Get plenty of rest   Sleep in humidified air  Use saline nasal sprays  Netti pot   OTC Medications:  Decongestants - helps relieve congestion   Flonase (generic fluticasone) or Nasacort (generic triamcinolone) - please make sure to use the "cross-over" technique at a 45 degree angle towards the opposite eye as opposed to straight up the nasal passageway.   Sudafed (generic pseudoephedrine - Note this is the one that is available behind the pharmacy counter); Products with phenylephrine (-PE) may also be used but is often not as effective as pseudoephedrine.   If you have HIGH BLOOD PRESSURE - Coricidin HBP; AVOID any product that is -D as this contains pseudoephedrine which may increase your blood pressure.  Afrin (oxymetazoline) every 6-8 hours for up to 3 days.   Allergies - helps relieve runny nose, itchy eyes and sneezing   Claritin (generic loratidine), Allegra (fexofenidine), or Zyrtec (generic cyrterizine) for runny nose.  These medications should not cause drowsiness.  Note - Benadryl (generic diphenhydramine) may be used however may cause drowsiness  Cough -   Delsym or Robitussin (generic dextromethorphan)  Expectorants - helps loosen mucus to ease removal   Mucinex (generic guaifenesin) as directed on the package.  Headaches / General Aches   Tylenol (generic acetaminophen) - DO NOT EXCEED 3 grams (3,000 mg) in a 24 hour time period  Advil/Motrin (generic ibuprofen)   Sore Throat -   Salt water gargle   Chloraseptic (generic benzocaine) spray or lozenges / Sucrets (generic dyclonine)      Acute Bronchitis Bronchitis is inflammation of the airways that extend from the windpipe into the lungs (bronchi). The inflammation often causes mucus to develop. This leads to a cough, which is the most common symptom of bronchitis.  In acute bronchitis, the condition usually develops suddenly and goes away over time, usually in a couple weeks. Smoking, allergies, and asthma can make bronchitis worse. Repeated episodes of bronchitis may cause further lung problems.  CAUSES Acute bronchitis is most often caused by the same virus that causes a cold. The virus can spread from person to person (contagious) through coughing, sneezing, and touching contaminated objects. SIGNS AND SYMPTOMS   Cough.   Fever.   Coughing up mucus.   Body aches.   Chest congestion.   Chills.   Shortness of breath.   Sore throat.  DIAGNOSIS  Acute bronchitis is usually diagnosed through a physical exam. Your health care provider will also ask you questions about your medical history. Tests, such as chest X-rays, are sometimes  done to rule out other conditions.  TREATMENT  Acute bronchitis usually goes away in a couple weeks. Oftentimes, no medical treatment is necessary. Medicines are sometimes given for relief of fever or cough. Antibiotic medicines are usually not needed but may be prescribed in certain  situations. In some cases, an inhaler may be recommended to help reduce shortness of breath and control the cough. A cool mist vaporizer may also be used to help thin bronchial secretions and make it easier to clear the chest.  HOME CARE INSTRUCTIONS  Get plenty of rest.   Drink enough fluids to keep your urine clear or pale yellow (unless you have a medical condition that requires fluid restriction). Increasing fluids may help thin your respiratory secretions (sputum) and reduce chest congestion, and it will prevent dehydration.   Take medicines only as directed by your health care provider.  If you were prescribed an antibiotic medicine, finish it all even if you start to feel better.  Avoid smoking and secondhand smoke. Exposure to cigarette smoke or irritating chemicals will make bronchitis worse. If you are a smoker, consider using nicotine gum or skin patches to help control withdrawal symptoms. Quitting smoking will help your lungs heal faster.   Reduce the chances of another bout of acute bronchitis by washing your hands frequently, avoiding people with cold symptoms, and trying not to touch your hands to your mouth, nose, or eyes.   Keep all follow-up visits as directed by your health care provider.  SEEK MEDICAL CARE IF: Your symptoms do not improve after 1 week of treatment.  SEEK IMMEDIATE MEDICAL CARE IF:  You develop an increased fever or chills.   You have chest pain.   You have severe shortness of breath.  You have bloody sputum.   You develop dehydration.  You faint or repeatedly feel like you are going to pass out.  You develop repeated vomiting.  You develop a severe headache. MAKE SURE YOU:   Understand these instructions.  Will watch your condition.  Will get help right away if you are not doing well or get worse.   This information is not intended to replace advice given to you by your health care provider. Make sure you discuss any questions  you have with your health care provider.   Document Released: 08/27/2004 Document Revised: 08/10/2014 Document Reviewed: 01/10/2013 Elsevier Interactive Patient Education [employer].

## 2016-06-04 NOTE — Assessment & Plan Note (Signed)
Symptoms and exam consistent with resolving bronchitis. Obtain x-ray to rule out underlying pneumonia although unlikely. Treat conservatively with over-the-counter medications as needed for symptom relief and supportive care. Follow-up if symptoms worsen or do not improve.

## 2016-06-04 NOTE — Telephone Encounter (Signed)
Noted  

## 2016-06-04 NOTE — Telephone Encounter (Signed)
Colonoscopy should be moved back about 2 weeks in order to let the bronchitis calm down before undergoing sedation for a procedure

## 2016-06-05 ENCOUNTER — Encounter: Payer: Self-pay | Admitting: Internal Medicine

## 2016-06-05 NOTE — Telephone Encounter (Signed)
Rescheduled for 11/30.

## 2016-06-11 ENCOUNTER — Other Ambulatory Visit: Payer: Self-pay | Admitting: Orthopedic Surgery

## 2016-06-11 DIAGNOSIS — M4722 Other spondylosis with radiculopathy, cervical region: Secondary | ICD-10-CM

## 2016-06-12 ENCOUNTER — Encounter: Payer: 59 | Admitting: Gastroenterology

## 2016-06-21 ENCOUNTER — Ambulatory Visit
Admission: RE | Admit: 2016-06-21 | Discharge: 2016-06-21 | Disposition: A | Discharge status: Home / Self Care | Payer: 59 | Source: Ambulatory Visit | Attending: Orthopedic Surgery | Admitting: Orthopedic Surgery

## 2016-06-21 DIAGNOSIS — M4722 Other spondylosis with radiculopathy, cervical region: Secondary | ICD-10-CM

## 2016-07-02 ENCOUNTER — Ambulatory Visit (AMBULATORY_SURGERY_CENTER): Payer: 59 | Admitting: Gastroenterology

## 2016-07-02 ENCOUNTER — Encounter: Payer: Self-pay | Admitting: Gastroenterology

## 2016-07-02 ENCOUNTER — Other Ambulatory Visit: Payer: Self-pay | Admitting: Gastroenterology

## 2016-07-02 VITALS — BP 141/79 | HR 77 | Temp 98.0°F | Resp 15 | Ht 64.0 in | Wt 179.0 lb

## 2016-07-02 DIAGNOSIS — R1032 Left lower quadrant pain: Secondary | ICD-10-CM

## 2016-07-02 DIAGNOSIS — D120 Benign neoplasm of cecum: Secondary | ICD-10-CM

## 2016-07-02 MED ORDER — SODIUM CHLORIDE 0.9 % IV SOLN: 500.0000 mL | INTRAVENOUS | Status: DC

## 2016-07-02 MED ORDER — HYOSCYAMINE SULFATE 0.125 MG SL SUBL: 0.1250 mg | SUBLINGUAL_TABLET | Freq: Four times a day (QID) | SUBLINGUAL | 2 refills | Status: AC | PRN

## 2016-07-02 NOTE — Progress Notes (Signed)
Report to PACU, RN, vss, BBS= Clear.  

## 2016-07-02 NOTE — Progress Notes (Signed)
Called to room to assist during endoscopic procedure.  Patient ID and intended procedure confirmed with present staff. Received instructions for my participation in the procedure from the performing physician.  

## 2016-07-02 NOTE — Op Note (Signed)
[facility] Patient Name: Jacqueline Lewis Procedure Date: 07/02/2016 9:14 AM MRN: [id number] Endoscopist: [place]. Loletha Carrow , MD Age: 55 Referring MD:  Date of Birth: 23-Feb-1961 Gender: Female Account #: [id number] Procedure:                Colonoscopy Indications:              Abdominal pain in the left lower quadrant Medicines:                Monitored Anesthesia Care Procedure:                Pre-Anesthesia Assessment:                           - Prior to the procedure, a History and Physical                            was performed, and patient medications and                            allergies were reviewed. The patient's tolerance of                            previous anesthesia was also reviewed. The risks                            and benefits of the procedure and the sedation                            options and risks were discussed with the patient.                            All questions were answered, and informed consent                            was obtained. Prior Anticoagulants: The patient has                            taken no previous anticoagulant or antiplatelet                            agents. ASA Grade Assessment: II - A patient with                            mild systemic disease. After reviewing the risks                            and benefits, the patient was deemed in                            satisfactory condition to undergo the procedure.                           After obtaining informed consent, the colonoscope  was passed under direct vision. Throughout the                            procedure, the patient's blood pressure, pulse, and                            oxygen saturations were monitored continuously. The                            Model CF-HQ190L [id number]) scope was introduced                            through the anus and advanced to the the cecum,                            identified by  appendiceal orifice and ileocecal                            valve. The colonoscopy was performed without                            difficulty. The patient tolerated the procedure                            well. The quality of the bowel preparation was                            good. The ileocecal valve, appendiceal orifice, and                            rectum were photographed. The bowel preparation                            used was SUPREP. Scope In: 9:23:20 AM Scope Out: 9:36:42 AM Scope Withdrawal Time: 0 hours 10 minutes 13 seconds  Total Procedure Duration: 0 hours 13 minutes 22 seconds  Findings:                 The perianal and digital rectal examinations were                            normal.                           A 4 x 2 mm polyp was found in the cecum. The polyp                            was sessile. The polyp was removed with a piecemeal                            technique using a cold biopsy forceps. Resection                            and retrieval were complete.  Multiple medium-mouthed diverticula were found in                            the left colon.                           The exam was otherwise without abnormality on                            direct and retroflexion views. Complications:            No immediate complications. Estimated Blood Loss:     Estimated blood loss: none. Impression:               - One 4 x 2 mm polyp in the cecum, removed                            piecemeal using a cold biopsy forceps. Resected and                            retrieved.                           - Diverticulosis in the left colon.                           - The examination was otherwise normal on direct                            and retroflexion views.                           LLQ pain appears to have been either subacute                            diverticulitis or IBS. Recommendation:           - Patient has a contact number  available for                            emergencies. The signs and symptoms of potential                            delayed complications were discussed with the                            patient. Return to normal activities tomorrow.                            Written discharge instructions were provided to the                            patient.                           - Resume previous diet.                           -  Continue present medications.                           - Await pathology results.                           - Repeat colonoscopy is recommended for                            surveillance. The colonoscopy date will be                            determined after pathology results from today's                            exam become available for review. Henry L. Loletha Carrow, MD 07/02/2016 9:41:34 AM This report has been signed electronically.

## 2016-07-02 NOTE — Patient Instructions (Signed)
YOU HAD AN ENDOSCOPIC PROCEDURE TODAY AT THE [facility]:   Refer to the procedure report that was given to you for any specific questions about what was found during the examination.  If the procedure report does not answer your questions, please call your gastroenterologist to clarify.  If you requested that your care partner not be given the details of your procedure findings, then the procedure report has been included in a sealed envelope for you to review at your convenience later.  YOU SHOULD EXPECT: Some feelings of bloating in the abdomen. Passage of more gas than usual.  Walking can help get rid of the air that was put into your GI tract during the procedure and reduce the bloating. If you had a lower endoscopy (such as a colonoscopy or flexible sigmoidoscopy) you may notice spotting of blood in your stool or on the toilet paper. If you underwent a bowel prep for your procedure, you may not have a normal bowel movement for a few days.  Please Note:  You might notice some irritation and congestion in your nose or some drainage.  This is from the oxygen used during your procedure.  There is no need for concern and it should clear up in a day or so.  SYMPTOMS TO REPORT IMMEDIATELY:   Following lower endoscopy (colonoscopy or flexible sigmoidoscopy):  Excessive amounts of blood in the stool  Significant tenderness or worsening of abdominal pains  Swelling of the abdomen that is new, acute  Fever of 100F or higher   For urgent or emergent issues, a gastroenterologist can be reached at any hour by calling [phone].   DIET:  We do recommend a small meal at first, but then you may proceed to your regular diet.  Drink plenty of fluids but you should avoid alcoholic beverages for 24 hours. Try to increase the fiber in your diet, and drink plenty of water.  ACTIVITY:  You should plan to take it easy for the rest of today and you should NOT DRIVE or use heavy machinery until  tomorrow (because of the sedation medicines used during the test).    FOLLOW UP: Our staff will call the number listed on your records the next business day following your procedure to check on you and address any questions or concerns that you may have regarding the information given to you following your procedure. If we do not reach you, we will leave a message.  However, if you are feeling well and you are not experiencing any problems, there is no need to return our call.  We will assume that you have returned to your regular daily activities without incident.  If any biopsies were taken you will be contacted by phone or by letter within the next 1-3 weeks.  Please call us at [phone] if you have not heard about the biopsies in 3 weeks.    SIGNATURES/CONFIDENTIALITY: You and/or your care partner have signed paperwork which will be entered into your electronic medical record.  These signatures attest to the fact that that the information above on your After Visit Summary has been reviewed and is understood.  Full responsibility of the confidentiality of this discharge information lies with you and/or your care-partner.  Read all of the handouts given to you by your recovery room nurse.  Thank-you for choosing us for your healthcare needs today. 

## 2016-07-03 ENCOUNTER — Telehealth: Payer: Self-pay

## 2016-07-03 NOTE — Telephone Encounter (Signed)
LM on pt.'s ans. Machine for follow up call following endoscopic procedure yesterday.   Will try to reach pt. Later today.

## 2016-07-03 NOTE — Telephone Encounter (Signed)
  Follow up Call-  Call back number 07/02/2016  Post procedure Call Back phone  # [phone]  Permission to leave phone message Yes  Some recent data might be hidden     Patient questions:  Do you have a fever, pain , or abdominal swelling? No. Pain Score  0 *  Have you tolerated food without any problems? Yes.    Have you been able to return to your normal activities? Yes.    Do you have any questions about your discharge instructions: Diet   No. Medications  No. Follow up visit  No.   Do you have questions or concerns about your Care? No.  Actions: * If pain score is 4 or above: No action needed, pain <4.

## 2016-07-09 ENCOUNTER — Encounter: Payer: Self-pay | Admitting: Gastroenterology

## 2016-08-28 DIAGNOSIS — M4712 Other spondylosis with myelopathy, cervical region: Secondary | ICD-10-CM | POA: Diagnosis not present

## 2016-09-17 ENCOUNTER — Other Ambulatory Visit: Payer: Self-pay

## 2016-09-17 DIAGNOSIS — Z1231 Encounter for screening mammogram for malignant neoplasm of breast: Secondary | ICD-10-CM

## 2016-09-29 DIAGNOSIS — Z8661 Personal history of infections of the central nervous system: Secondary | ICD-10-CM | POA: Diagnosis not present

## 2016-09-29 DIAGNOSIS — G43119 Migraine with aura, intractable, without status migrainosus: Secondary | ICD-10-CM | POA: Diagnosis not present

## 2016-09-29 DIAGNOSIS — R51 Headache: Secondary | ICD-10-CM | POA: Diagnosis not present

## 2016-10-16 DIAGNOSIS — M50123 Cervical disc disorder at C6-C7 level with radiculopathy: Secondary | ICD-10-CM | POA: Diagnosis not present

## 2016-10-16 DIAGNOSIS — M50122 Cervical disc disorder at C5-C6 level with radiculopathy: Secondary | ICD-10-CM | POA: Diagnosis not present

## 2016-11-02 ENCOUNTER — Ambulatory Visit
Admission: RE | Admit: 2016-11-02 | Discharge: 2016-11-02 | Disposition: A | Discharge status: Home / Self Care | Payer: 59 | Source: Ambulatory Visit | Attending: General Surgery | Admitting: General Surgery

## 2016-11-02 DIAGNOSIS — Z1231 Encounter for screening mammogram for malignant neoplasm of breast: Secondary | ICD-10-CM | POA: Diagnosis not present

## 2016-11-11 ENCOUNTER — Encounter: Payer: Self-pay | Admitting: General Surgery

## 2016-11-11 ENCOUNTER — Ambulatory Visit (INDEPENDENT_AMBULATORY_CARE_PROVIDER_SITE_OTHER): Payer: 59 | Admitting: General Surgery

## 2016-11-11 VITALS — BP 114/80 | HR 106 | Resp 12 | Ht 63.0 in | Wt 175.0 lb

## 2016-11-11 DIAGNOSIS — Z853 Personal history of malignant neoplasm of breast: Secondary | ICD-10-CM

## 2016-11-11 NOTE — Patient Instructions (Signed)
The patient has been asked to return to the office in one year with a bilateral screening mammogram. 

## 2016-11-11 NOTE — Progress Notes (Addendum)
Patient ID: Jacqueline Lewis, female   DOB: 09-09-1960, 56 y.o.   MRN: [id number]  Chief Complaint  Patient presents with  . Follow-up    HPI Jacqueline Lewis is a 56 y.o. female who presents for a breast evaluation. The most recent mammogram was done on 11/02/2016 .  Patient does perform regular self breast checks and gets regular mammograms done.    HPI  Past Medical History:  Diagnosis Date  . Arthritis   . Basal cell cancer 2010   Left upper arm  . Carcinoma in situ of breast 2005  . Cervical dysplasia    young age, no treatment  . IBS (irritable bowel syndrome) 2005  . Meningitis Oct 2014  . Migraines   . Obesity, unspecified   . Osteoarthritis   . Personal history of tobacco use, presenting hazards to health   . Special screening for malignant neoplasms, colon 2013  . Squamous cell carcinoma     Past Surgical History:  Procedure Laterality Date  . ABDOMINAL HYSTERECTOMY  2006   TAH BSO  . BREAST LUMPECTOMY W/ NEEDLE LOCALIZATION Left 04/2004  . COLONOSCOPY  2008,06/2016   Jacqueline Lewis  . LASIK Bilateral   . LIPOMA EXCISION    . LUMBAR PUNCTURE  Oct 2014  . MOHS SURGERY    . NASAL SEPTUM SURGERY  may 2014  . SKIN CANCER EXCISION     Left arm    Family History  Problem Relation Age of Onset  . Hypertension Mother   . Hyperlipidemia Mother   . Hypertension Father   . Multiple myeloma Father   . Breast cancer Maternal Aunt 90  . Diabetes Maternal Aunt   . Colon cancer Neg Hx     Social History Social History  Substance Use Topics  . Smoking status: Current Some Day Smoker    Packs/day: 0.25    Types: Cigarettes, E-cigarettes    Last attempt to quit: 08/13/2016  . Smokeless tobacco: Never Used  . Alcohol use 0.0 oz/week     Comment: Rare    Allergies  Allergen Reactions  . Codeine Nausea Only    Current Outpatient Prescriptions  Medication Sig Dispense Refill  . aspirin 81 MG tablet Take 81 mg by mouth daily.    . Biotin 5000 MCG TABS Take 1  tablet by mouth daily.    . Cholecalciferol (VITAMIN D) 2000 UNITS CAPS Take by mouth daily.    . clonazePAM (KLONOPIN) 1 MG tablet TAKE 0.5-1 TABLET BY MOUTH TWICE A DAY AS NEEDED 60 tablet 5  . cyclobenzaprine (FLEXERIL) 10 MG tablet Take 10 mg by mouth 3 (three) times daily as needed for muscle spasms.    Marland Kitchen gabapentin (NEURONTIN) 300 MG capsule Take 1 capsule (300 mg total) by mouth 3 (three) times daily. 270 capsule 3  . HYDROcodone-acetaminophen (NORCO/VICODIN) 5-325 MG tablet Take 1 tablet by mouth daily as needed for moderate pain. 90 tablet 0  . hyoscyamine (LEVSIN SL) 0.125 MG SL tablet Place 1 tablet (0.125 mg total) under the tongue every 6 (six) hours as needed. 30 tablet 2  . meloxicam (MOBIC) 7.5 MG tablet 1 tab by mouth twice per day as needed for pain 60 tablet 3  . Probiotic Product (PROBIOTIC PO) Take by mouth.    . SUMAtriptan (IMITREX) 100 MG tablet Take 1 tablet (100 mg total) by mouth every 2 (two) hours as needed for migraine. May repeat in 2 hours if headache persists or recurs., limit 10 tabs per  month 30 tablet 3  . topiramate (TOPAMAX) 50 MG tablet Take 50 mg by mouth 2 (two) times daily.    . traZODone (DESYREL) 50 MG tablet TAKE 2 TABLETS BY MOUTH AT BEDTIME WHEN NECESSARY FOR INSOMNIA 180 tablet 1   Current Facility-Administered Medications  Medication Dose Route Frequency Provider Last Rate Last Dose  . 0.9 %  sodium chloride infusion  500 mL Intravenous Continuous Nelida Meuse III, MD        Review of Systems Review of Systems  Constitutional: Negative.   Respiratory: Negative.   Cardiovascular: Negative.     Blood pressure 114/80, pulse (!) 106, resp. rate 12, height 5' 3"  (1.6 m), weight 175 lb (79.4 kg).  Physical Exam Physical Exam  Constitutional: She is oriented to person, place, and time. She appears well-developed and well-nourished.  Eyes: Conjunctivae are normal. No scleral icterus.  Neck: Neck supple.  Cardiovascular: Normal rate, regular  rhythm and normal heart sounds.   Pulmonary/Chest: Effort normal and breath sounds normal. Right breast exhibits no inverted nipple, no mass, no nipple discharge, no skin change and no tenderness. Left breast exhibits no inverted nipple, no mass, no nipple discharge, no skin change and no tenderness.  Well healed incision at 6 o'clock , slight thicken left breast.  Lymphadenopathy:    She has no cervical adenopathy.    She has no axillary adenopathy.  Neurological: She is alert and oriented to person, place, and time.  Skin: Skin is warm.    Data Reviewed 11/03/2016 bilateral mammograms were reviewed, screening. No interval change. BI-RADS-1.  Assessment    No evidence of recurrent breast cancer.    Plan    The patient has not undergone genetic testing, even thought she was diagnosed at or before age 32.  Pros and cons of testing reviewed. She is interested in pursuing testing.  This will be arranged with Jacqueline Lewis, R.N.     The patient has been asked to return to the office in one year with a bilateral screening mammogram.  HPI, Physical Exam, Assessment and Plan have been scribed under the direction and in the presence of Jacqueline Ard, MD.  Gaspar Cola, CMA  I have completed the exam and reviewed the above documentation for accuracy and completeness.  I agree with the above.  Haematologist has been used and any errors in dictation or transcription are unintentional.  Jacqueline Lewis, M.D., F.A.C.S.    Jacqueline Lewis 11/12/2016, 7:23 AM

## 2016-11-15 ENCOUNTER — Other Ambulatory Visit: Payer: Self-pay | Admitting: Internal Medicine

## 2016-11-16 NOTE — Telephone Encounter (Signed)
Routing to dr john, please advise, thanks 

## 2016-11-16 NOTE — Telephone Encounter (Signed)
Trazodone 1 mo only refill done  Please ask pt to make ROV for further refills

## 2016-11-17 ENCOUNTER — Ambulatory Visit (INDEPENDENT_AMBULATORY_CARE_PROVIDER_SITE_OTHER): Payer: 59 | Admitting: *Deleted

## 2016-11-17 DIAGNOSIS — Z853 Personal history of malignant neoplasm of breast: Secondary | ICD-10-CM | POA: Diagnosis not present

## 2016-11-17 DIAGNOSIS — Z1371 Encounter for nonprocreative screening for genetic disease carrier status: Secondary | ICD-10-CM

## 2016-11-17 HISTORY — DX: Encounter for nonprocreative screening for genetic disease carrier status: Z13.71

## 2016-11-17 NOTE — Telephone Encounter (Signed)
Patient advised, she has scheduled appt in may

## 2016-11-19 NOTE — Progress Notes (Signed)
Patient ID: Jacqueline Lewis, female   DOB: November 27, 1960, 56 y.o.   MRN: [id number]  Here today for genetic testing. Order form and test completed and sent to [facility].

## 2016-11-19 NOTE — Patient Instructions (Signed)
The patient is aware to call back for any questions or concerns.  

## 2016-12-01 ENCOUNTER — Encounter: Payer: Self-pay | Admitting: Internal Medicine

## 2016-12-01 ENCOUNTER — Ambulatory Visit: Payer: 59 | Admitting: Internal Medicine

## 2016-12-01 ENCOUNTER — Ambulatory Visit (INDEPENDENT_AMBULATORY_CARE_PROVIDER_SITE_OTHER): Payer: 59 | Admitting: Internal Medicine

## 2016-12-01 VITALS — BP 126/80 | HR 97 | Ht 64.0 in | Wt 176.0 lb

## 2016-12-01 DIAGNOSIS — Z0000 Encounter for general adult medical examination without abnormal findings: Secondary | ICD-10-CM | POA: Diagnosis not present

## 2016-12-01 DIAGNOSIS — G43809 Other migraine, not intractable, without status migrainosus: Secondary | ICD-10-CM

## 2016-12-01 MED ORDER — CLONAZEPAM 1 MG PO TABS: ORAL_TABLET | ORAL | 2 refills | Status: DC

## 2016-12-01 MED ORDER — TRAZODONE HCL 50 MG PO TABS: ORAL_TABLET | ORAL | 1 refills | Status: DC

## 2016-12-01 NOTE — Progress Notes (Signed)
Pre visit review using our clinic review tool, if applicable. No additional management support is needed unless otherwise documented below in the visit note. 

## 2016-12-01 NOTE — Patient Instructions (Signed)

## 2016-12-01 NOTE — Progress Notes (Signed)
Subjective:    Patient ID: Jacqueline Lewis, female    DOB: Mar 27, 1961, 56 y.o.   MRN: [id number]  HPI  Here for wellness and f/u;  Overall doing ok;  Pt denies Chest pain, worsening SOB, DOE, wheezing, orthopnea, PND, worsening LE edema, palpitations, dizziness or syncope.  Pt denies neurological change such as new headache, facial or extremity weakness.  Pt denies polydipsia, polyuria, or low sugar symptoms. Pt states overall good compliance with treatment and medications, good tolerability, and has been trying to follow appropriate diet.  Pt denies worsening depressive symptoms, suicidal ideation or panic. No fever, night sweats, wt loss, loss of appetite, or other constitutional symptoms.  Pt states good ability with ADL's, has low fall risk, home safety reviewed and adequate, no other significant changes in hearing or vision, and only occasionally active with exercise.  Plans to call for GYn exam with Dr Oswald Hillock.  No other complaints, No other hx Past Medical History:  Diagnosis Date  . Arthritis   . Basal cell cancer 2010   Left upper arm  . Carcinoma in situ of breast 2005  . Cervical dysplasia    young age, no treatment  . IBS (irritable bowel syndrome) 2005  . Meningitis Oct 2014  . Migraines   . Obesity, unspecified   . Osteoarthritis   . Personal history of tobacco use, presenting hazards to health   . Special screening for malignant neoplasms, colon 2013  . Squamous cell carcinoma    Past Surgical History:  Procedure Laterality Date  . ABDOMINAL HYSTERECTOMY  2006   TAH BSO  . BREAST LUMPECTOMY W/ NEEDLE LOCALIZATION Left 04/2004  . COLONOSCOPY  2008,06/2016   [name]  . LASIK Bilateral   . LIPOMA EXCISION    . LUMBAR PUNCTURE  Oct 2014  . MOHS SURGERY    . NASAL SEPTUM SURGERY  may 2014  . SKIN CANCER EXCISION     Left arm    reports that she has been smoking Cigarettes and E-cigarettes.  She has been smoking about 0.25 packs per day. She has never used  smokeless tobacco. She reports that she drinks alcohol. She reports that she does not use drugs. family history includes Breast cancer (age of onset: 59) in her maternal aunt; Diabetes in her maternal aunt; Hyperlipidemia in her mother; Hypertension in her father and mother; Multiple myeloma in her father. Allergies  Allergen Reactions  . Codeine Nausea Only   Current Outpatient Prescriptions on File Prior to Visit  Medication Sig Dispense Refill  . aspirin 81 MG tablet Take 81 mg by mouth daily.    . Biotin 5000 MCG TABS Take 1 tablet by mouth daily.    . Cholecalciferol (VITAMIN D) 2000 UNITS CAPS Take by mouth daily.    . cyclobenzaprine (FLEXERIL) 10 MG tablet Take 10 mg by mouth 3 (three) times daily as needed for muscle spasms.    Marland Kitchen HYDROcodone-acetaminophen (NORCO/VICODIN) 5-325 MG tablet Take 1 tablet by mouth daily as needed for moderate pain. 90 tablet 0  . hyoscyamine (LEVSIN SL) 0.125 MG SL tablet Place 1 tablet (0.125 mg total) under the tongue every 6 (six) hours as needed. 30 tablet 2  . meloxicam (MOBIC) 7.5 MG tablet 1 tab by mouth twice per day as needed for pain 60 tablet 3  . Probiotic Product (PROBIOTIC PO) Take by mouth.    . SUMAtriptan (IMITREX) 100 MG tablet Take 1 tablet (100 mg total) by mouth every 2 (two) hours as  needed for migraine. May repeat in 2 hours if headache persists or recurs., limit 10 tabs per month 30 tablet 3   Current Facility-Administered Medications on File Prior to Visit  Medication Dose Route Frequency Provider Last Rate Last Dose  . 0.9 %  sodium chloride infusion  500 mL Intravenous Continuous Nelida Meuse III, MD       Review of Systems Constitutional: Negative for other unusual diaphoresis, sweats, appetite or weight changes HENT: Negative for other worsening hearing loss, ear pain, facial swelling, mouth sores or neck stiffness.   Eyes: Negative for other worsening pain, redness or other visual disturbance.  Respiratory: Negative for  other stridor or swelling Cardiovascular: Negative for other palpitations or other chest pain  Gastrointestinal: Negative for worsening diarrhea or loose stools, blood in stool, distention or other pain Genitourinary: Negative for hematuria, flank pain or other change in urine volume.  Musculoskeletal: Negative for myalgias or other joint swelling.  Skin: Negative for other color change, or other wound or worsening drainage.  Neurological: Negative for other syncope or numbness. Hematological: Negative for other adenopathy or swelling Psychiatric/Behavioral: Negative for hallucinations, other worsening agitation, SI, self-injury, or new decreased concentration All other system neg per pt    Objective:   Physical Exam BP 126/80   Pulse 97   Ht _0  (1.626 m)   Wt 176 lb (79.8 kg)   SpO2 98%   BMI 30.21 kg/m  VS noted,  Constitutional: Pt is oriented to person, place, and time. Appears well-developed and well-nourished, in no significant distress and comfortable Head: Normocephalic and atraumatic  Eyes: Conjunctivae and EOM are normal. Pupils are equal, round, and reactive to light Right Ear: External ear normal without discharge Left Ear: External ear normal without discharge Nose: Nose without discharge or deformity Mouth/Throat: Oropharynx is without other ulcerations and moist  Neck: Normal range of motion. Neck supple. No JVD present. No tracheal deviation present or significant neck LA or mass Cardiovascular: Normal rate, regular rhythm, normal heart sounds and intact distal pulses.   Pulmonary/Chest: WOB normal and breath sounds without rales or wheezing  Abdominal: Soft. Bowel sounds are normal. NT. No HSM  Musculoskeletal: Normal range of motion. Exhibits no edema Lymphadenopathy: Has no other cervical adenopathy.  Neurological: Pt is alert and oriented to person, place, and time. Pt has normal reflexes. No cranial nerve deficit. Motor grossly intact, Gait intact Skin: Skin  is warm and dry. No rash noted or new ulcerations Psychiatric:  Has normal mood and affect. Behavior is normal without agitation No other exam findiungs    Assessment & Plan:

## 2016-12-03 NOTE — Assessment & Plan Note (Signed)
Stable, for med refills

## 2016-12-03 NOTE — Assessment & Plan Note (Signed)

## 2016-12-09 ENCOUNTER — Encounter: Payer: Self-pay | Admitting: General Surgery

## 2016-12-09 ENCOUNTER — Encounter: Payer: Self-pay | Admitting: *Deleted

## 2016-12-10 ENCOUNTER — Telehealth: Payer: Self-pay | Admitting: *Deleted

## 2016-12-10 NOTE — Telephone Encounter (Signed)
Please notify the patient that the testing was fine-BRCA. No abnormalities noted.

## 2016-12-25 ENCOUNTER — Other Ambulatory Visit (INDEPENDENT_AMBULATORY_CARE_PROVIDER_SITE_OTHER): Payer: 59

## 2016-12-25 DIAGNOSIS — Z0000 Encounter for general adult medical examination without abnormal findings: Secondary | ICD-10-CM | POA: Diagnosis not present

## 2016-12-25 LAB — CBC WITH DIFFERENTIAL/PLATELET
BASOS PCT: 0.9 % (ref 0.0–3.0)
Basophils Absolute: 0.1 10*3/uL (ref 0.0–0.1)
EOS ABS: 0.2 10*3/uL (ref 0.0–0.7)
Eosinophils Relative: 3.3 % (ref 0.0–5.0)
HCT: 44.2 % (ref 36.0–46.0)
HEMOGLOBIN: 15 g/dL (ref 12.0–15.0)
LYMPHS ABS: 2.1 10*3/uL (ref 0.7–4.0)
Lymphocytes Relative: 32.6 % (ref 12.0–46.0)
MCHC: 33.8 g/dL (ref 30.0–36.0)
MCV: 89.5 fl (ref 78.0–100.0)
MONO ABS: 0.5 10*3/uL (ref 0.1–1.0)
Monocytes Relative: 7.9 % (ref 3.0–12.0)
NEUTROS PCT: 55.3 % (ref 43.0–77.0)
Neutro Abs: 3.6 10*3/uL (ref 1.4–7.7)
Platelets: 276 10*3/uL (ref 150.0–400.0)
RBC: 4.94 Mil/uL (ref 3.87–5.11)
RDW: 13.4 % (ref 11.5–15.5)
WBC: 6.4 10*3/uL (ref 4.0–10.5)

## 2016-12-25 LAB — URINALYSIS, ROUTINE W REFLEX MICROSCOPIC
BILIRUBIN URINE: NEGATIVE
Hgb urine dipstick: NEGATIVE
KETONES UR: NEGATIVE
Leukocytes, UA: NEGATIVE
NITRITE: NEGATIVE
PH: 6 (ref 5.0–8.0)
Total Protein, Urine: NEGATIVE
URINE GLUCOSE: NEGATIVE
Urobilinogen, UA: 0.2 (ref 0.0–1.0)
WBC, UA: NONE SEEN — AB (ref 0–?)

## 2016-12-25 LAB — HEPATIC FUNCTION PANEL
ALBUMIN: 4.5 g/dL (ref 3.5–5.2)
ALK PHOS: 89 U/L (ref 39–117)
ALT: 15 U/L (ref 0–35)
AST: 14 U/L (ref 0–37)
Bilirubin, Direct: 0.2 mg/dL (ref 0.0–0.3)
Total Bilirubin: 0.3 mg/dL (ref 0.2–1.2)
Total Protein: 7.5 g/dL (ref 6.0–8.3)

## 2016-12-25 LAB — LIPID PANEL
CHOLESTEROL: 223 mg/dL — AB (ref 0–200)
HDL: 84.9 mg/dL (ref >39.00)
LDL Cholesterol: 120 mg/dL — ABNORMAL HIGH (ref 0–99)
NonHDL: 137.7
TRIGLYCERIDES: 87 mg/dL (ref 0.0–149.0)
Total CHOL/HDL Ratio: 3
VLDL: 17.4 mg/dL (ref 0.0–40.0)

## 2016-12-25 LAB — BASIC METABOLIC PANEL
BUN: 12 mg/dL (ref 6–23)
CHLORIDE: 107 meq/L (ref 96–112)
CO2: 28 mEq/L (ref 19–32)
Calcium: 10 mg/dL (ref 8.4–10.5)
Creatinine, Ser: 1.02 mg/dL (ref 0.40–1.20)
GFR: 59.5 mL/min — ABNORMAL LOW (ref >60.00)
GLUCOSE: 93 mg/dL (ref 70–99)
POTASSIUM: 3.9 meq/L (ref 3.5–5.1)
SODIUM: 142 meq/L (ref 135–145)

## 2016-12-25 LAB — TSH: TSH: 0.96 u[IU]/mL (ref 0.35–4.50)

## 2016-12-31 ENCOUNTER — Encounter: Payer: Self-pay | Admitting: Internal Medicine

## 2017-01-22 DIAGNOSIS — M50122 Cervical disc disorder at C5-C6 level with radiculopathy: Secondary | ICD-10-CM | POA: Diagnosis not present

## 2017-01-22 DIAGNOSIS — M50123 Cervical disc disorder at C6-C7 level with radiculopathy: Secondary | ICD-10-CM | POA: Diagnosis not present

## 2017-01-22 DIAGNOSIS — M5412 Radiculopathy, cervical region: Secondary | ICD-10-CM | POA: Diagnosis not present

## 2017-03-09 DIAGNOSIS — R51 Headache: Secondary | ICD-10-CM | POA: Diagnosis not present

## 2017-03-09 DIAGNOSIS — G43119 Migraine with aura, intractable, without status migrainosus: Secondary | ICD-10-CM | POA: Diagnosis not present

## 2017-05-28 DIAGNOSIS — S61216A Laceration without foreign body of right little finger without damage to nail, initial encounter: Secondary | ICD-10-CM | POA: Diagnosis not present

## 2017-06-21 ENCOUNTER — Other Ambulatory Visit: Payer: Self-pay | Admitting: Internal Medicine

## 2017-06-21 NOTE — Telephone Encounter (Signed)
Done erx 

## 2017-06-30 DIAGNOSIS — M25512 Pain in left shoulder: Secondary | ICD-10-CM | POA: Diagnosis not present

## 2017-06-30 DIAGNOSIS — L905 Scar conditions and fibrosis of skin: Secondary | ICD-10-CM | POA: Diagnosis not present

## 2017-07-07 DIAGNOSIS — M5410 Radiculopathy, site unspecified: Secondary | ICD-10-CM | POA: Diagnosis not present

## 2017-07-07 DIAGNOSIS — G894 Chronic pain syndrome: Secondary | ICD-10-CM | POA: Diagnosis not present

## 2017-08-04 DIAGNOSIS — M5410 Radiculopathy, site unspecified: Secondary | ICD-10-CM | POA: Diagnosis not present

## 2017-08-04 DIAGNOSIS — Z79891 Long term (current) use of opiate analgesic: Secondary | ICD-10-CM | POA: Diagnosis not present

## 2017-08-04 DIAGNOSIS — G894 Chronic pain syndrome: Secondary | ICD-10-CM | POA: Diagnosis not present

## 2017-08-16 DIAGNOSIS — G894 Chronic pain syndrome: Secondary | ICD-10-CM | POA: Diagnosis not present

## 2017-09-08 DIAGNOSIS — M5412 Radiculopathy, cervical region: Secondary | ICD-10-CM | POA: Diagnosis not present

## 2017-09-10 DIAGNOSIS — R51 Headache: Secondary | ICD-10-CM | POA: Diagnosis not present

## 2017-09-10 DIAGNOSIS — R200 Anesthesia of skin: Secondary | ICD-10-CM | POA: Diagnosis not present

## 2017-09-10 DIAGNOSIS — G43119 Migraine with aura, intractable, without status migrainosus: Secondary | ICD-10-CM | POA: Diagnosis not present

## 2017-09-22 DIAGNOSIS — M5412 Radiculopathy, cervical region: Secondary | ICD-10-CM | POA: Diagnosis not present

## 2017-09-23 DIAGNOSIS — G43119 Migraine with aura, intractable, without status migrainosus: Secondary | ICD-10-CM | POA: Diagnosis not present

## 2017-09-23 DIAGNOSIS — R51 Headache: Secondary | ICD-10-CM | POA: Diagnosis not present

## 2017-10-04 ENCOUNTER — Other Ambulatory Visit: Payer: Self-pay

## 2017-10-04 DIAGNOSIS — Z1231 Encounter for screening mammogram for malignant neoplasm of breast: Secondary | ICD-10-CM

## 2017-10-12 DIAGNOSIS — L989 Disorder of the skin and subcutaneous tissue, unspecified: Secondary | ICD-10-CM | POA: Diagnosis not present

## 2017-10-28 ENCOUNTER — Telehealth: Payer: Self-pay

## 2017-10-28 NOTE — Telephone Encounter (Signed)
Returned call to the number listed below however I do not believe that contact number is correct. I would get a busy signal.  Copied from [place] [phone]. Topic: Medical Record Request - Provider/Facility Request >> Oct 27, 2017  1:37 PM Tye [place] wrote: Requestor Name/Agency: [facility] Call Back #: [phone] Fax#: [phone] Information Requested: Labs and EKG's    Route to [place] for [place] [facility]. For all other clinics, route to the clinic's PEC Pool.

## 2017-11-03 ENCOUNTER — Ambulatory Visit
Admission: RE | Admit: 2017-11-03 | Discharge: 2017-11-03 | Disposition: A | Discharge status: Home / Self Care | Payer: 59 | Source: Ambulatory Visit | Attending: General Surgery | Admitting: General Surgery

## 2017-11-03 DIAGNOSIS — Z1231 Encounter for screening mammogram for malignant neoplasm of breast: Secondary | ICD-10-CM | POA: Diagnosis not present

## 2017-11-15 ENCOUNTER — Ambulatory Visit (INDEPENDENT_AMBULATORY_CARE_PROVIDER_SITE_OTHER): Payer: 59 | Admitting: Gynecology

## 2017-11-15 ENCOUNTER — Encounter: Payer: Self-pay | Admitting: Gynecology

## 2017-11-15 VITALS — BP 118/74 | Ht 65.0 in | Wt 171.0 lb

## 2017-11-15 DIAGNOSIS — Z1321 Encounter for screening for nutritional disorder: Secondary | ICD-10-CM | POA: Diagnosis not present

## 2017-11-15 DIAGNOSIS — Z13220 Encounter for screening for lipoid disorders: Secondary | ICD-10-CM

## 2017-11-15 DIAGNOSIS — R8290 Unspecified abnormal findings in urine: Secondary | ICD-10-CM | POA: Diagnosis not present

## 2017-11-15 DIAGNOSIS — Z1329 Encounter for screening for other suspected endocrine disorder: Secondary | ICD-10-CM

## 2017-11-15 DIAGNOSIS — Z853 Personal history of malignant neoplasm of breast: Secondary | ICD-10-CM | POA: Diagnosis not present

## 2017-11-15 DIAGNOSIS — N952 Postmenopausal atrophic vaginitis: Secondary | ICD-10-CM | POA: Diagnosis not present

## 2017-11-15 DIAGNOSIS — Z01411 Encounter for gynecological examination (general) (routine) with abnormal findings: Secondary | ICD-10-CM

## 2017-11-15 NOTE — Progress Notes (Signed)
    Jacqueline Lewis [birth year] [id number]        57 y.o.  G0P0 for annual gynecologic exam.  Doing well without gynecologic complaints.  Past medical history,surgical history, problem list, medications, allergies, family history and social history were all reviewed and documented as reviewed in the EPIC chart.  ROS:  Performed with pertinent positives and negatives included in the history, assessment and plan.   Additional significant findings : None   Exam: Caryn Bee assistant Vitals:   11/15/17 1605  BP: 118/74  Weight: 171 lb (77.6 kg)  Height: 5\' 5"  (1.651 m)   Body mass index is 28.46 kg/m.  General appearance:  Normal affect, orientation and appearance. Skin: Grossly normal HEENT: Without gross lesions.  No cervical or supraclavicular adenopathy. Thyroid normal.  Lungs:  Clear without wheezing, rales or rhonchi Cardiac: RR, without RMG Abdominal:  Soft, nontender, without masses, guarding, rebound, organomegaly or hernia Breasts:  Examined lying and sitting without masses, retractions, discharge or axillary adenopathy. Pelvic:  Ext, BUS, Vagina: With atrophic changes.  Pap smear of vaginal cuff done  Adnexa: Without masses or tenderness    Anus and perineum: Normal   Rectovaginal: Normal sphincter tone without palpated masses or tenderness.    Assessment/Plan:  57 y.o. G0P0 female for annual gynecologic exam status post TAH/BSO 2006 for endometriosis.   1. Postmenopausal/atrophic genital changes.  Without significant symptoms. 2. Pap smear 2015.  Pap smear done today.  History of dysplasia as a young woman of unknown degree.  Recent Pap smear is negative. 3. History of left breast cancer status post lumpectomy and radiation 2005.  Exam NED.  Mammography 11/2017.  Continue with annual mammography next year. 4. Colonoscopy 2017.  Repeat at their recommended interval. 5. DEXA never.  Will plan at age 40.  Check vitamin D level today. 6. Urine odor.  Patient notes  intermittently some odor to her urine.  No UTI symptoms such as frequency, dysuria, urgency, low back pain fever or chills.  Check baseline urine analysis today. 7. Health maintenance.  Patient requests baseline labs.  Has been fasting all day.  CBC, CMP, lipid profile, vitamin D and TSH ordered.     Anastasio Auerbach MD, 4:42 PM 11/15/2017

## 2017-11-16 ENCOUNTER — Other Ambulatory Visit: Payer: Self-pay | Admitting: *Deleted

## 2017-11-16 ENCOUNTER — Ambulatory Visit: Payer: 59 | Admitting: General Surgery

## 2017-11-16 ENCOUNTER — Encounter: Payer: Self-pay | Admitting: General Surgery

## 2017-11-16 ENCOUNTER — Ambulatory Visit (INDEPENDENT_AMBULATORY_CARE_PROVIDER_SITE_OTHER): Payer: 59 | Admitting: General Surgery

## 2017-11-16 VITALS — BP 122/74 | HR 78 | Resp 14 | Ht 64.0 in | Wt 172.0 lb

## 2017-11-16 DIAGNOSIS — Z853 Personal history of malignant neoplasm of breast: Secondary | ICD-10-CM | POA: Diagnosis not present

## 2017-11-16 DIAGNOSIS — R7309 Other abnormal glucose: Secondary | ICD-10-CM

## 2017-11-16 LAB — COMPREHENSIVE METABOLIC PANEL
AG Ratio: 1.7 (calc) (ref 1.0–2.5)
ALBUMIN MSPROF: 4.3 g/dL (ref 3.6–5.1)
ALKALINE PHOSPHATASE (APISO): 83 U/L (ref 33–130)
ALT: 14 U/L (ref 6–29)
AST: 13 U/L (ref 10–35)
BUN: 19 mg/dL (ref 7–25)
CO2: 27 mmol/L (ref 20–32)
Calcium: 9.5 mg/dL (ref 8.6–10.4)
Chloride: 107 mmol/L (ref 98–110)
Creat: 0.94 mg/dL (ref 0.50–1.05)
Globulin: 2.6 g/dL (calc) (ref 1.9–3.7)
Glucose, Bld: 110 mg/dL — ABNORMAL HIGH (ref 65–99)
POTASSIUM: 4.2 mmol/L (ref 3.5–5.3)
Sodium: 141 mmol/L (ref 135–146)
Total Bilirubin: 0.4 mg/dL (ref 0.2–1.2)
Total Protein: 6.9 g/dL (ref 6.1–8.1)

## 2017-11-16 LAB — PAP IG W/ RFLX HPV ASCU

## 2017-11-16 LAB — LIPID PANEL
CHOLESTEROL: 233 mg/dL — AB (ref <200)
HDL: 94 mg/dL (ref >50)
LDL CHOLESTEROL (CALC): 122 mg/dL — AB
Non-HDL Cholesterol (Calc): 139 mg/dL (calc) — ABNORMAL HIGH (ref <130)
TRIGLYCERIDES: 74 mg/dL (ref <150)
Total CHOL/HDL Ratio: 2.5 (calc) (ref <5.0)

## 2017-11-16 LAB — CBC WITH DIFFERENTIAL/PLATELET
BASOS ABS: 53 {cells}/uL (ref 0–200)
Basophils Relative: 0.7 %
Eosinophils Absolute: 160 cells/uL (ref 15–500)
Eosinophils Relative: 2.1 %
HEMATOCRIT: 41.6 % (ref 35.0–45.0)
HEMOGLOBIN: 14.6 g/dL (ref 11.7–15.5)
LYMPHS ABS: 2333 {cells}/uL (ref 850–3900)
MCH: 30.9 pg (ref 27.0–33.0)
MCHC: 35.1 g/dL (ref 32.0–36.0)
MCV: 87.9 fL (ref 80.0–100.0)
MPV: 9.9 fL (ref 7.5–12.5)
Monocytes Relative: 7.2 %
NEUTROS PCT: 59.3 %
Neutro Abs: 4507 cells/uL (ref 1500–7800)
Platelets: 289 10*3/uL (ref 140–400)
RBC: 4.73 10*6/uL (ref 3.80–5.10)
RDW: 12.9 % (ref 11.0–15.0)
Total Lymphocyte: 30.7 %
WBC: 7.6 10*3/uL (ref 3.8–10.8)
WBCMIX: 547 {cells}/uL (ref 200–950)

## 2017-11-16 LAB — TSH: TSH: 0.64 mIU/L (ref 0.40–4.50)

## 2017-11-16 LAB — VITAMIN D 25 HYDROXY (VIT D DEFICIENCY, FRACTURES): Vit D, 25-Hydroxy: 37 ng/mL (ref 30–100)

## 2017-11-16 NOTE — Patient Instructions (Addendum)
The patient is aware to call back for any questions or concerns. Follow up in one year.

## 2017-11-16 NOTE — Progress Notes (Signed)
Patient ID: Jacqueline Lewis, female   DOB: 1961/01/31, 57 y.o.   MRN: [id number]  Chief Complaint  Patient presents with  . Follow-up    mammogram    HPI Jacqueline Lewis is a 57 y.o. female who presents for a breast evaluation. The most recent mammogram was done on 11/03/17. Patient does perform regular self breast checks and gets regular mammograms done. She has no new breast complaints.     HPI  Past Medical History:  Diagnosis Date  . Arthritis   . Basal cell cancer 2010   Left upper arm  . BRCA negative 11/17/2016   negative  . Breast cancer ([place])   . Carcinoma in situ of breast 2005  . Cervical dysplasia    young age, no treatment  . IBS (irritable bowel syndrome) 2005  . Meningitis Oct 2014  . Migraines   . Obesity, unspecified   . Osteoarthritis   . Personal history of tobacco use, presenting hazards to health   . Special screening for malignant neoplasms, colon 2013  . Squamous cell carcinoma     Past Surgical History:  Procedure Laterality Date  . ABDOMINAL HYSTERECTOMY  2006   TAH BSO  . BREAST LUMPECTOMY W/ NEEDLE LOCALIZATION Left 04/2004  . CERVICAL DISC SURGERY    . COLONOSCOPY  2008,06/2016   [name]  . LASIK Bilateral   . LIPOMA EXCISION    . LUMBAR PUNCTURE  Oct 2014  . MOHS SURGERY    . NASAL SEPTUM SURGERY  may 2014  . SKIN CANCER EXCISION     Left arm    Family History  Problem Relation Age of Onset  . Hypertension Mother   . Hyperlipidemia Mother   . Hypertension Father   . Multiple myeloma Father   . Breast cancer Maternal Aunt 90  . Diabetes Maternal Aunt   . Colon cancer Neg Hx     Social History Social History   Tobacco Use  . Smoking status: Current Every Day Smoker    Packs/day: 0.25    Types: E-cigarettes    Last attempt to quit: 08/13/2016    Years since quitting: 1.2  . Smokeless tobacco: Never Used  . Tobacco comment: Vapes  Substance Use Topics  . Alcohol use: Yes    Alcohol/week: 0.0 oz    Comment: Rare  .  Drug use: No    Allergies  Allergen Reactions  . Codeine Nausea Only    Current Outpatient Medications  Medication Sig Dispense Refill  . aspirin 81 MG tablet Take 81 mg by mouth daily.    . Biotin 5000 MCG TABS Take 1 tablet by mouth daily.    . Cholecalciferol (VITAMIN D) 2000 UNITS CAPS Take by mouth daily.    . clonazePAM (KLONOPIN) 1 MG tablet TAKE 0.5-1 TABLET BY MOUTH TWICE A DAY AS NEEDED 60 tablet 2  . cyclobenzaprine (FLEXERIL) 10 MG tablet Take 10 mg by mouth 3 (three) times daily as needed for muscle spasms.    Marland Kitchen gabapentin (NEURONTIN) 300 MG capsule Take 300 mg by mouth daily.    Marland Kitchen HYDROcodone-acetaminophen (NORCO/VICODIN) 5-325 MG tablet Take 1 tablet by mouth daily as needed for moderate pain. 90 tablet 0  . hyoscyamine (LEVSIN SL) 0.125 MG SL tablet Place 1 tablet (0.125 mg total) under the tongue every 6 (six) hours as needed. 30 tablet 2  . meloxicam (MOBIC) 7.5 MG tablet 1 tab by mouth twice per day as needed for pain 60 tablet 3  .  Probiotic Product (PROBIOTIC PO) Take by mouth.    . SUMAtriptan (IMITREX) 100 MG tablet Take 1 tablet (100 mg total) by mouth every 2 (two) hours as needed for migraine. May repeat in 2 hours if headache persists or recurs., limit 10 tabs per month 30 tablet 3  . topiramate (TOPAMAX) 50 MG tablet Take 50 mg by mouth daily.    . traZODone (DESYREL) 50 MG tablet TAKE 2 TABLETS BY MOUTH AT BEDTIME WHEN NECESSARY FOR INSOMNIA 180 tablet 1   Current Facility-Administered Medications  Medication Dose Route Frequency Provider Last Rate Last Dose  . 0.9 %  sodium chloride infusion  500 mL Intravenous Continuous Danis, Kirke Corin, MD        Review of Systems Review of Systems  Constitutional: Negative.   Respiratory: Negative.   Cardiovascular: Negative.     Blood pressure 122/74, pulse 78, resp. rate 14, height _0  (1.626 m), weight 172 lb (78 kg).  Physical Exam Physical Exam  Constitutional: She appears well-developed and  well-nourished.  Neck: Neck supple.  Cardiovascular: Normal rate and regular rhythm.  Pulmonary/Chest: Effort normal and breath sounds normal.    Lymphadenopathy:    She has no axillary adenopathy.       Right: No supraclavicular adenopathy present.       Left: No supraclavicular adenopathy present.    Data Reviewed November 03, 2017 screening mammograms were reviewed.  BI-RADS-1.  Assessment    No evidence of recurrent or new breast cancer.  Plan    Follow up in one year with bilateral screening mammogram and office visit    HPI, Physical Exam, Assessment and Plan have been scribed under the direction and in the presence of Robert Bellow, MD  Concepcion Living, LPN  I have completed the exam and reviewed the above documentation for accuracy and completeness.  I agree with the above.  Haematologist has been used and any errors in dictation or transcription are unintentional.  Hervey Ard, M.D., F.A.C.S.   Forest Gleason Huong Luthi 11/17/2017, 4:30 PM

## 2017-12-08 DIAGNOSIS — M5412 Radiculopathy, cervical region: Secondary | ICD-10-CM | POA: Diagnosis not present

## 2017-12-08 DIAGNOSIS — R0683 Snoring: Secondary | ICD-10-CM | POA: Diagnosis not present

## 2017-12-08 DIAGNOSIS — R000 Tachycardia, unspecified: Secondary | ICD-10-CM | POA: Diagnosis not present

## 2017-12-08 DIAGNOSIS — G43119 Migraine with aura, intractable, without status migrainosus: Secondary | ICD-10-CM | POA: Diagnosis not present

## 2017-12-14 ENCOUNTER — Ambulatory Visit (INDEPENDENT_AMBULATORY_CARE_PROVIDER_SITE_OTHER): Payer: 59 | Admitting: Internal Medicine

## 2017-12-14 ENCOUNTER — Encounter: Payer: Self-pay | Admitting: Internal Medicine

## 2017-12-14 VITALS — BP 126/84 | HR 105 | Temp 98.4°F | Ht 64.0 in | Wt 173.0 lb

## 2017-12-14 DIAGNOSIS — G8929 Other chronic pain: Secondary | ICD-10-CM

## 2017-12-14 DIAGNOSIS — Z0001 Encounter for general adult medical examination with abnormal findings: Secondary | ICD-10-CM | POA: Diagnosis not present

## 2017-12-14 DIAGNOSIS — W57XXXA Bitten or stung by nonvenomous insect and other nonvenomous arthropods, initial encounter: Secondary | ICD-10-CM | POA: Diagnosis not present

## 2017-12-14 DIAGNOSIS — E785 Hyperlipidemia, unspecified: Secondary | ICD-10-CM

## 2017-12-14 DIAGNOSIS — M545 Low back pain: Secondary | ICD-10-CM | POA: Diagnosis not present

## 2017-12-14 DIAGNOSIS — S30861A Insect bite (nonvenomous) of abdominal wall, initial encounter: Secondary | ICD-10-CM

## 2017-12-14 DIAGNOSIS — M5090 Cervical disc disorder, unspecified, unspecified cervical region: Secondary | ICD-10-CM | POA: Insufficient documentation

## 2017-12-14 DIAGNOSIS — R739 Hyperglycemia, unspecified: Secondary | ICD-10-CM | POA: Diagnosis not present

## 2017-12-14 DIAGNOSIS — Z114 Encounter for screening for human immunodeficiency virus [HIV]: Secondary | ICD-10-CM

## 2017-12-14 DIAGNOSIS — M542 Cervicalgia: Secondary | ICD-10-CM

## 2017-12-14 LAB — POCT GLYCOSYLATED HEMOGLOBIN (HGB A1C): HEMOGLOBIN A1C: 4.9

## 2017-12-14 MED ORDER — HYDROCODONE-ACETAMINOPHEN 5-325 MG PO TABS: 1.0000 | ORAL_TABLET | Freq: Every day | ORAL | 0 refills | Status: DC | PRN

## 2017-12-14 MED ORDER — DOXYCYCLINE HYCLATE 100 MG PO TABS: 100.0000 mg | ORAL_TABLET | Freq: Two times a day (BID) | ORAL | 0 refills | Status: DC

## 2017-12-14 MED ORDER — ROSUVASTATIN CALCIUM 10 MG PO TABS: 10.0000 mg | ORAL_TABLET | Freq: Every day | ORAL | 3 refills | Status: DC

## 2017-12-14 NOTE — Assessment & Plan Note (Signed)

## 2017-12-14 NOTE — Assessment & Plan Note (Signed)
Lab Results  Component Value Date   HGBA1C 4.9 12/14/2017   stable overall by history and exam, recent data reviewed with pt, and pt to continue medical treatment as before,  to f/u any worsening symptoms or concerns

## 2017-12-14 NOTE — Assessment & Plan Note (Addendum)
Chronic stable , for pain med refill  In addition to the time spent performing CPE, I spent an additional 15 minutes face to face,in which greater than 50% of this time was spent in counseling and coordination of care for patient's illness as documented, including the differential dx, treatment, further evaluation and other management of chronic pain, hyperglycemia and anxiety and HLD and tick bites

## 2017-12-14 NOTE — Assessment & Plan Note (Signed)
To start crestor 10 qd, low chol diet

## 2017-12-14 NOTE — Patient Instructions (Addendum)
Your A1c was Ok today  Please have your Shingles shot done at [facility] as they do it for free  Please take all new medication as prescribed - the antibiotic, and the crestor for cholesterol  Please continue all other medications as before, and refills have been done if requested - the pain medication  Please have the pharmacy call with any other refills you may need.  Please continue your efforts at being more active, low cholesterol diet, and weight control.  You are otherwise up to date with prevention measures today.  Please keep your appointments with your specialists as you may have planned  Please return in 1 year for your yearly visit, or sooner if needed, with Lab testing done 3-5 days before

## 2017-12-14 NOTE — Assessment & Plan Note (Signed)
Mild to mod, for antibx course,  to f/u any worsening symptoms or concerns 

## 2017-12-14 NOTE — Assessment & Plan Note (Signed)
Has seen neurology with chronic pain and radicular pain to LUE; has only used #90 hydrocodone prn

## 2017-12-14 NOTE — Progress Notes (Signed)
Subjective:    Patient ID: Jacqueline Lewis, female    DOB: [birth year], 57 y.o.   MRN: [id number]  HPI    Here for wellness and f/u;  Overall doing ok;  Pt denies Chest pain, worsening SOB, DOE, wheezing, orthopnea, PND, worsening LE edema, palpitations, dizziness or syncope.  Pt denies neurological change such as new headache, facial or extremity weakness.  Pt denies polydipsia, polyuria, or low sugar symptoms. Pt states overall good compliance with treatment and medications, good tolerability, and has been trying to follow appropriate diet.  Pt denies worsening depressive symptoms, suicidal ideation or panic. No fever, night sweats, wt loss, loss of appetite, or other constitutional symptoms.  Pt states good ability with ADL's, has low fall risk, home safety reviewed and adequate, no other significant changes in hearing or vision, and not active with exercise.  Had post meal elev sugar with GYN, due for a1c.  Has pulled off 5 ticks since [place]   Pt denies fever, wt loss, night sweats, loss of appetite, or other constitutional symptoms  No other interval change or new complaints except has known cervical DDD and chronic nerve pain.  She is ok with starting statin for HLD, no prior problems. Past Medical History:  Diagnosis Date  . Arthritis   . Basal cell cancer 2010   Left upper arm  . BRCA negative 11/17/2016   negative  . Breast cancer ([place])   . Carcinoma in situ of breast 2005  . Cervical disc disease   . Cervical dysplasia    young age, no treatment  . IBS (irritable bowel syndrome) 2005  . Meningitis Oct 2014  . Migraines   . Obesity, unspecified   . Osteoarthritis   . Personal history of tobacco use, presenting hazards to health   . Special screening for malignant neoplasms, colon 2013  . Squamous cell carcinoma    Past Surgical History:  Procedure Laterality Date  . ABDOMINAL HYSTERECTOMY  2006   TAH BSO  . BREAST LUMPECTOMY  W/ NEEDLE LOCALIZATION Left 04/2004  . CERVICAL DISC SURGERY    . COLONOSCOPY  2008,06/2016   [name]  . LASIK Bilateral   . LIPOMA EXCISION    . LUMBAR PUNCTURE  Oct 2014  . MOHS SURGERY    . NASAL SEPTUM SURGERY  may 2014  . SKIN CANCER EXCISION     Left arm    reports that she has been smoking e-cigarettes.  She has been smoking about 0.25 packs per day. She has never used smokeless tobacco. She reports that she drinks alcohol. She reports that she does not use drugs. family history includes Breast cancer (age of onset: 60) in her maternal aunt; Diabetes in her maternal aunt; Hyperlipidemia in her mother; Hypertension in her father and mother; Multiple myeloma in her father. Allergies  Allergen Reactions  . Codeine Nausea Only   Current Outpatient Medications on File Prior to Visit  Medication Sig Dispense Refill  . aspirin 81 MG tablet Take 81 mg by mouth daily.    . Biotin 5000 MCG TABS Take 1 tablet by mouth daily.    . Cholecalciferol (VITAMIN D) 2000 UNITS CAPS Take by mouth daily.    . clonazePAM (KLONOPIN) 1 MG tablet TAKE 0.5-1 TABLET BY MOUTH TWICE A DAY AS NEEDED 60 tablet 2  . cyclobenzaprine (FLEXERIL) 10 MG tablet Take 10 mg by mouth 3 (three) times daily as needed for muscle spasms.    Marland Kitchen  gabapentin (NEURONTIN) 300 MG capsule Take 300 mg by mouth daily.    . hyoscyamine (LEVSIN SL) 0.125 MG SL tablet Place 1 tablet (0.125 mg total) under the tongue every 6 (six) hours as needed. 30 tablet 2  . meloxicam (MOBIC) 7.5 MG tablet 1 tab by mouth twice per day as needed for pain 60 tablet 3  . Probiotic Product (PROBIOTIC PO) Take by mouth.    . SUMAtriptan (IMITREX) 100 MG tablet Take 1 tablet (100 mg total) by mouth every 2 (two) hours as needed for migraine. May repeat in 2 hours if headache persists or recurs., limit 10 tabs per month 30 tablet 3  . topiramate (TOPAMAX) 50 MG tablet Take 50 mg by mouth daily.    . traZODone (DESYREL) 50 MG tablet TAKE 2 TABLETS BY MOUTH  AT BEDTIME WHEN NECESSARY FOR INSOMNIA 180 tablet 1   No current facility-administered medications on file prior to visit.    Review of Systems Constitutional: Negative for other unusual diaphoresis, sweats, appetite or weight changes HENT: Negative for other worsening hearing loss, ear pain, facial swelling, mouth sores or neck stiffness.   Eyes: Negative for other worsening pain, redness or other visual disturbance.  Respiratory: Negative for other stridor or swelling Cardiovascular: Negative for other palpitations or other chest pain  Gastrointestinal: Negative for worsening diarrhea or loose stools, blood in stool, distention or other pain Genitourinary: Negative for hematuria, flank pain or other change in urine volume.  Musculoskeletal: Negative for myalgias or other joint swelling.  Skin: Negative for other color change, or other wound or worsening drainage.  Neurological: Negative for other syncope or numbness. Hematological: Negative for other adenopathy or swelling Psychiatric/Behavioral: Negative for hallucinations, other worsening agitation, SI, self-injury, or new decreased concentration All other system neg per pt    Objective:   Physical Exam BP 126/84   Pulse (!) 105   Temp 98.4 F (36.9 C) (Oral)   Ht 5' 4"  (1.626 m)   Wt 173 lb (78.5 kg)   SpO2 96%   BMI 29.70 kg/m  VS noted,  Constitutional: Pt is oriented to person, place, and time. Appears well-developed and well-nourished, in no significant distress and comfortable Head: Normocephalic and atraumatic  Eyes: Conjunctivae and EOM are normal. Pupils are equal, round, and reactive to light Right Ear: External ear normal without discharge Left Ear: External ear normal without discharge Nose: Nose without discharge or deformity Mouth/Throat: Oropharynx is without other ulcerations and moist  Neck: Normal range of motion. Neck supple. No JVD present. No tracheal deviation present or significant neck LA or  mass Cardiovascular: Normal rate, regular rhythm, normal heart sounds and intact distal pulses.   Pulmonary/Chest: WOB normal and breath sounds without rales or wheezing  Abdominal: Soft. Bowel sounds are normal. NT. No HSM  Musculoskeletal: Normal range of motion. Exhibits no edema Lymphadenopathy: Has no other cervical adenopathy.  Neurological: Pt is alert and oriented to person, place, and time. Pt has normal reflexes. No cranial nerve deficit. Motor grossly intact, Gait intact Skin: Skin is warm and dry. No rash noted or new ulcerations except has multiple at least 5 tick bite sites, with left groin with 2 cm area erythema mild tender Psychiatric:  Has nervous mood and affect. Behavior is normal without agitation No other exam findings  POCT glycosylated hemoglobin (Hb A1C)  Component 10:08  Hemoglobin A1C 4.9            Assessment & Plan:

## 2017-12-17 DIAGNOSIS — Z853 Personal history of malignant neoplasm of breast: Secondary | ICD-10-CM

## 2017-12-20 DIAGNOSIS — R000 Tachycardia, unspecified: Secondary | ICD-10-CM | POA: Diagnosis not present

## 2017-12-21 ENCOUNTER — Other Ambulatory Visit: Payer: Self-pay | Admitting: Internal Medicine

## 2017-12-22 DIAGNOSIS — G4733 Obstructive sleep apnea (adult) (pediatric): Secondary | ICD-10-CM | POA: Diagnosis not present

## 2018-01-07 ENCOUNTER — Encounter: Payer: Self-pay | Admitting: *Deleted

## 2018-01-19 DIAGNOSIS — G4733 Obstructive sleep apnea (adult) (pediatric): Secondary | ICD-10-CM | POA: Diagnosis not present

## 2018-02-18 DIAGNOSIS — G4733 Obstructive sleep apnea (adult) (pediatric): Secondary | ICD-10-CM | POA: Diagnosis not present

## 2018-03-10 ENCOUNTER — Telehealth: Payer: Self-pay | Admitting: Internal Medicine

## 2018-03-10 NOTE — Telephone Encounter (Signed)
Was faxed on 02/15/18 Will resend

## 2018-03-10 NOTE — Telephone Encounter (Signed)
Copied from [place] [id number]. Topic: Quick Communication - See Telephone Encounter >> Mar 10, 2018 12:49 PM Mylinda Latina, NT wrote: CRM for notification. See Telephone encounter for: 03/10/18. John calling form [employer] states she faxed over a form or to have a letter sent stating the patient is a non smoker for life insurance purposes. He has not received anything back FAX# [phone]

## 2018-03-21 DIAGNOSIS — G4733 Obstructive sleep apnea (adult) (pediatric): Secondary | ICD-10-CM | POA: Diagnosis not present

## 2018-04-21 DIAGNOSIS — G4733 Obstructive sleep apnea (adult) (pediatric): Secondary | ICD-10-CM | POA: Diagnosis not present

## 2018-05-11 DIAGNOSIS — G4733 Obstructive sleep apnea (adult) (pediatric): Secondary | ICD-10-CM | POA: Diagnosis not present

## 2018-05-19 DIAGNOSIS — G4733 Obstructive sleep apnea (adult) (pediatric): Secondary | ICD-10-CM | POA: Diagnosis not present

## 2018-05-21 DIAGNOSIS — G4733 Obstructive sleep apnea (adult) (pediatric): Secondary | ICD-10-CM | POA: Diagnosis not present

## 2018-05-24 DIAGNOSIS — M5412 Radiculopathy, cervical region: Secondary | ICD-10-CM | POA: Diagnosis not present

## 2018-05-24 DIAGNOSIS — R51 Headache: Secondary | ICD-10-CM | POA: Diagnosis not present

## 2018-05-24 DIAGNOSIS — G43109 Migraine with aura, not intractable, without status migrainosus: Secondary | ICD-10-CM | POA: Diagnosis not present

## 2018-06-14 ENCOUNTER — Other Ambulatory Visit: Payer: Self-pay | Admitting: Internal Medicine

## 2018-06-21 DIAGNOSIS — G4733 Obstructive sleep apnea (adult) (pediatric): Secondary | ICD-10-CM | POA: Diagnosis not present

## 2018-07-21 DIAGNOSIS — G4733 Obstructive sleep apnea (adult) (pediatric): Secondary | ICD-10-CM | POA: Diagnosis not present

## 2018-08-21 DIAGNOSIS — G4733 Obstructive sleep apnea (adult) (pediatric): Secondary | ICD-10-CM | POA: Diagnosis not present

## 2018-08-26 DIAGNOSIS — G4733 Obstructive sleep apnea (adult) (pediatric): Secondary | ICD-10-CM | POA: Diagnosis not present

## 2018-09-21 DIAGNOSIS — G4733 Obstructive sleep apnea (adult) (pediatric): Secondary | ICD-10-CM | POA: Diagnosis not present

## 2018-09-22 ENCOUNTER — Other Ambulatory Visit: Payer: Self-pay | Admitting: Internal Medicine

## 2018-09-22 MED ORDER — CLONAZEPAM 1 MG PO TABS: ORAL_TABLET | ORAL | 2 refills | Status: DC

## 2018-09-22 NOTE — Telephone Encounter (Signed)
Copied from [place] [id number]. Topic: Quick Communication - Rx Refill/Question >> Sep 22, 2018 11:06 AM Judyann Munson wrote: Medication: clonazePAM (KLONOPIN) 1 MG tablet  Has the patient contacted their pharmacy? Yes   Preferred Pharmacy (with phone number or street name): [id number] - [place], [address] [place] [address] Phone: [phone] Fax: [phone] Not a 24 hour pharmacy; exact hours not known.    Agent: Please be advised that RX refills may take up to 3 business days. We ask that you follow-up with your pharmacy.

## 2018-09-22 NOTE — Telephone Encounter (Signed)
Requested medication (s) are due for refill today: Yes  Requested medication (s) are on the active medication list: Yes  Last refill:  12/21/16  Future visit scheduled: Yes  Notes to clinic:  Unable to refill per protocol     Requested Prescriptions  Pending Prescriptions Disp Refills   clonazePAM (KLONOPIN) 1 MG tablet 60 tablet 2    Sig: TAKE 0.5-1 TABLET BY MOUTH TWICE A DAY AS NEEDED     Not Delegated - Psychiatry:  Anxiolytics/Hypnotics Failed - 09/22/2018 11:11 AM      Failed - This refill cannot be delegated      Failed - Urine Drug Screen completed in last 360 days.      Failed - Valid encounter within last 6 months    Recent Outpatient Visits          9 months ago Encounter for well adult exam with abnormal findings   [facility] -Georges Mouse, MD   1 year ago Preventative health care   [facility] -Georges Mouse, MD   2 years ago [place]   [place], [place]   2 years ago Encounter for preventative adult health care exam with abnormal findings   [facility] -Junie Bame, Hunt Oris, MD   3 years ago Preventative health care   [facility] -Georges Mouse, MD      Future Appointments            In 2 months Jenny Reichmann, Hunt Oris, MD [place], [facility]

## 2018-09-22 NOTE — Telephone Encounter (Signed)
Done erx 

## 2018-10-04 ENCOUNTER — Other Ambulatory Visit: Payer: Self-pay

## 2018-10-04 DIAGNOSIS — Z1231 Encounter for screening mammogram for malignant neoplasm of breast: Secondary | ICD-10-CM

## 2018-10-04 NOTE — Progress Notes (Signed)
.  scr

## 2018-10-20 DIAGNOSIS — G4733 Obstructive sleep apnea (adult) (pediatric): Secondary | ICD-10-CM | POA: Diagnosis not present

## 2018-11-10 ENCOUNTER — Ambulatory Visit: Payer: 59 | Admitting: General Surgery

## 2018-12-05 ENCOUNTER — Other Ambulatory Visit: Payer: Self-pay | Admitting: Internal Medicine

## 2018-12-12 ENCOUNTER — Other Ambulatory Visit: Payer: Self-pay | Admitting: Internal Medicine

## 2018-12-15 ENCOUNTER — Ambulatory Visit: Payer: 59 | Admitting: General Surgery

## 2018-12-16 ENCOUNTER — Other Ambulatory Visit (INDEPENDENT_AMBULATORY_CARE_PROVIDER_SITE_OTHER): Payer: 59

## 2018-12-16 ENCOUNTER — Ambulatory Visit (INDEPENDENT_AMBULATORY_CARE_PROVIDER_SITE_OTHER): Payer: 59 | Admitting: Internal Medicine

## 2018-12-16 ENCOUNTER — Encounter: Payer: Self-pay | Admitting: Internal Medicine

## 2018-12-16 ENCOUNTER — Other Ambulatory Visit: Payer: Self-pay

## 2018-12-16 ENCOUNTER — Encounter: Payer: 59 | Admitting: Internal Medicine

## 2018-12-16 VITALS — BP 120/84 | HR 108 | Temp 98.1°F | Ht 64.0 in | Wt 183.0 lb

## 2018-12-16 DIAGNOSIS — R739 Hyperglycemia, unspecified: Secondary | ICD-10-CM

## 2018-12-16 DIAGNOSIS — Z0000 Encounter for general adult medical examination without abnormal findings: Secondary | ICD-10-CM | POA: Diagnosis not present

## 2018-12-16 LAB — CBC WITH DIFFERENTIAL/PLATELET
Basophils Absolute: 0.1 10*3/uL (ref 0.0–0.1)
Basophils Relative: 0.9 % (ref 0.0–3.0)
Eosinophils Absolute: 0.2 10*3/uL (ref 0.0–0.7)
Eosinophils Relative: 1.8 % (ref 0.0–5.0)
HCT: 43 % (ref 36.0–46.0)
Hemoglobin: 14.7 g/dL (ref 12.0–15.0)
Lymphocytes Relative: 24.4 % (ref 12.0–46.0)
Lymphs Abs: 2.3 10*3/uL (ref 0.7–4.0)
MCHC: 34.1 g/dL (ref 30.0–36.0)
MCV: 90.5 fl (ref 78.0–100.0)
Monocytes Absolute: 0.7 10*3/uL (ref 0.1–1.0)
Monocytes Relative: 7.9 % (ref 3.0–12.0)
Neutro Abs: 6.1 10*3/uL (ref 1.4–7.7)
Neutrophils Relative %: 65 % (ref 43.0–77.0)
Platelets: 303 10*3/uL (ref 150.0–400.0)
RBC: 4.75 Mil/uL (ref 3.87–5.11)
RDW: 13.2 % (ref 11.5–15.5)
WBC: 9.4 10*3/uL (ref 4.0–10.5)

## 2018-12-16 LAB — URINALYSIS, ROUTINE W REFLEX MICROSCOPIC
Bilirubin Urine: NEGATIVE
Hgb urine dipstick: NEGATIVE
Ketones, ur: NEGATIVE
Leukocytes,Ua: NEGATIVE
Nitrite: NEGATIVE
Specific Gravity, Urine: 1.025 (ref 1.000–1.030)
Total Protein, Urine: NEGATIVE
Urine Glucose: NEGATIVE
Urobilinogen, UA: 0.2 (ref 0.0–1.0)
pH: 5 (ref 5.0–8.0)

## 2018-12-16 LAB — TSH: TSH: 0.84 u[IU]/mL (ref 0.35–4.50)

## 2018-12-16 LAB — LIPID PANEL
Cholesterol: 144 mg/dL (ref 0–200)
HDL: 81.6 mg/dL (ref >39.00)
LDL Cholesterol: 45 mg/dL (ref 0–99)
NonHDL: 61.97
Total CHOL/HDL Ratio: 2
Triglycerides: 86 mg/dL (ref 0.0–149.0)
VLDL: 17.2 mg/dL (ref 0.0–40.0)

## 2018-12-16 LAB — BASIC METABOLIC PANEL
BUN: 14 mg/dL (ref 6–23)
CO2: 26 mEq/L (ref 19–32)
Calcium: 9.6 mg/dL (ref 8.4–10.5)
Chloride: 107 mEq/L (ref 96–112)
Creatinine, Ser: 0.88 mg/dL (ref 0.40–1.20)
GFR: 65.92 mL/min (ref >60.00)
Glucose, Bld: 88 mg/dL (ref 70–99)
Potassium: 4.2 mEq/L (ref 3.5–5.1)
Sodium: 142 mEq/L (ref 135–145)

## 2018-12-16 LAB — HEPATIC FUNCTION PANEL
ALT: 15 U/L (ref 0–35)
AST: 15 U/L (ref 0–37)
Albumin: 4.5 g/dL (ref 3.5–5.2)
Alkaline Phosphatase: 91 U/L (ref 39–117)
Bilirubin, Direct: 0.1 mg/dL (ref 0.0–0.3)
Total Bilirubin: 0.3 mg/dL (ref 0.2–1.2)
Total Protein: 7.6 g/dL (ref 6.0–8.3)

## 2018-12-16 LAB — HEMOGLOBIN A1C: Hgb A1c MFr Bld: 5.5 % (ref 4.6–6.5)

## 2018-12-16 NOTE — Patient Instructions (Signed)

## 2018-12-16 NOTE — Progress Notes (Signed)
Subjective:    Patient ID: Jacqueline Lewis, female    DOB: [birth year], 58 y.o.   MRN: [id number]  HPI  Here for wellness and f/u;  Overall doing ok;  Pt denies Chest pain, worsening SOB, DOE, wheezing, orthopnea, PND, worsening LE edema, palpitations, dizziness or syncope.  Pt denies neurological change such as new headache, facial or extremity weakness.  Pt denies polydipsia, polyuria, or low sugar symptoms. Pt states overall good compliance with treatment and medications, good tolerability, and has been trying to follow appropriate diet.  Pt denies worsening depressive symptoms, suicidal ideation or panic. No fever, night sweats, wt loss, loss of appetite, or other constitutional symptoms.  Pt states good ability with ADL's, has low fall risk, home safety reviewed and adequate, no other significant changes in hearing or vision, and only occasionally active with exercise. No new complaints Past Medical History:  Diagnosis Date  . Arthritis   . Basal cell cancer 2010   Left upper arm  . BRCA negative 11/17/2016   negative  . Breast cancer ([place])   . Carcinoma in situ of breast 2005  . Cervical disc disease   . Cervical dysplasia    young age, no treatment  . IBS (irritable bowel syndrome) 2005  . Meningitis Oct 2014  . Migraines   . Obesity, unspecified   . Osteoarthritis   . Personal history of tobacco use, presenting hazards to health   . Special screening for malignant neoplasms, colon 2013  . Squamous cell carcinoma    Past Surgical History:  Procedure Laterality Date  . ABDOMINAL HYSTERECTOMY  2006   TAH BSO  . BREAST LUMPECTOMY W/ NEEDLE LOCALIZATION Left 04/2004  . CERVICAL DISC SURGERY    . COLONOSCOPY  2008,06/2016   [name]  . LASIK Bilateral   . LIPOMA EXCISION    . LUMBAR PUNCTURE  Oct 2014  . MOHS SURGERY    . NASAL SEPTUM SURGERY  may 2014  . SKIN CANCER EXCISION     Left arm    reports that she quit smoking about 2 years ago. Her smoking use included  cigarettes. She smoked 0.25 packs per day. She has never used smokeless tobacco. She reports current alcohol use. She reports that she does not use drugs. family history includes Breast cancer (age of onset: 56) in her maternal aunt; Diabetes in her maternal aunt; Hyperlipidemia in her mother; Hypertension in her father and mother; Multiple myeloma in her father. Allergies  Allergen Reactions  . Codeine Nausea Only   Current Outpatient Medications on File Prior to Visit  Medication Sig Dispense Refill  . aspirin 81 MG tablet Take 81 mg by mouth daily.    . Biotin 5000 MCG TABS Take 1 tablet by mouth daily.    . Cholecalciferol (VITAMIN D) 2000 UNITS CAPS Take by mouth daily.    . clonazePAM (KLONOPIN) 1 MG tablet TAKE 0.5-1 TABLET BY MOUTH TWICE A DAY AS NEEDED 60 tablet 2  . cyclobenzaprine (FLEXERIL) 10 MG tablet Take 10 mg by mouth 3 (three) times daily as needed for muscle spasms.    Marland Kitchen doxycycline (VIBRA-TABS) 100 MG tablet Take 1 tablet (100 mg total) by mouth 2 (two) times daily. 20 tablet 0  . gabapentin (NEURONTIN) 300 MG capsule Take 300 mg by mouth daily.    Marland Kitchen HYDROcodone-acetaminophen (NORCO/VICODIN) 5-325 MG tablet Take 1 tablet by mouth daily as needed for moderate pain. Chronic neck pain 90 tablet 0  . hyoscyamine (LEVSIN SL) 0.125  MG SL tablet Place 1 tablet (0.125 mg total) under the tongue every 6 (six) hours as needed. 30 tablet 2  . meloxicam (MOBIC) 7.5 MG tablet 1 tab by mouth twice per day as needed for pain 60 tablet 3  . Probiotic Product (PROBIOTIC PO) Take by mouth.    . rosuvastatin (CRESTOR) 10 MG tablet TAKE 1 TABLET BY MOUTH EVERY DAY 90 tablet 0  . SUMAtriptan (IMITREX) 100 MG tablet Take 1 tablet (100 mg total) by mouth every 2 (two) hours as needed for migraine. May repeat in 2 hours if headache persists or recurs., limit 10 tabs per month 30 tablet 3  . topiramate (TOPAMAX) 50 MG tablet Take 50 mg by mouth daily.    . traZODone (DESYREL) 50 MG tablet TAKE 2  TABLETS BY MOUTH AT BEDTIME WHEN NECESSARY FOR INSOMNIA 180 tablet 0   No current facility-administered medications on file prior to visit.    Review of Systems Constitutional: Negative for other unusual diaphoresis, sweats, appetite or weight changes HENT: Negative for other worsening hearing loss, ear pain, facial swelling, mouth sores or neck stiffness.   Eyes: Negative for other worsening pain, redness or other visual disturbance.  Respiratory: Negative for other stridor or swelling Cardiovascular: Negative for other palpitations or other chest pain  Gastrointestinal: Negative for worsening diarrhea or loose stools, blood in stool, distention or other pain Genitourinary: Negative for hematuria, flank pain or other change in urine volume.  Musculoskeletal: Negative for myalgias or other joint swelling.  Skin: Negative for other color change, or other wound or worsening drainage.  Neurological: Negative for other syncope or numbness. Hematological: Negative for other adenopathy or swelling Psychiatric/Behavioral: Negative for hallucinations, other worsening agitation, SI, self-injury, or new decreased concentration All other system neg per pt    Objective:   Physical Exam BP 120/84   Pulse (!) 108   Temp 98.1 F (36.7 C) (Oral)   Ht 5' 4" (1.626 m)   Wt 183 lb (83 kg)   SpO2 94%   BMI 31.41 kg/m  VS noted, obese Constitutional: Pt is oriented to person, place, and time. Appears well-developed and well-nourished, in no significant distress and comfortable Head: Normocephalic and atraumatic  Eyes: Conjunctivae and EOM are normal. Pupils are equal, round, and reactive to light Right Ear: External ear normal without discharge Left Ear: External ear normal without discharge Nose: Nose without discharge or deformity Mouth/Throat: Oropharynx is without other ulcerations and moist  Neck: Normal range of motion. Neck supple. No JVD present. No tracheal deviation present or significant  neck LA or mass Cardiovascular: Normal rate, regular rhythm, normal heart sounds and intact distal pulses.   Pulmonary/Chest: WOB normal and breath sounds without rales or wheezing  Abdominal: Soft. Bowel sounds are normal. NT. No HSM  Musculoskeletal: Normal range of motion. Exhibits no edema Lymphadenopathy: Has no other cervical adenopathy.  Neurological: Pt is alert and oriented to person, place, and time. Pt has normal reflexes. No cranial nerve deficit. Motor grossly intact, Gait intact Skin: Skin is warm and dry. No rash noted or new ulcerations Psychiatric:  Has normal mood and affect. Behavior is normal without agitation, mild nervous No other exam findings Lab Results  Component Value Date   WBC 9.4 12/16/2018   HGB 14.7 12/16/2018   HCT 43.0 12/16/2018   PLT 303.0 12/16/2018   GLUCOSE 88 12/16/2018   CHOL 144 12/16/2018   TRIG 86.0 12/16/2018   HDL 81.60 12/16/2018   LDLCALC 45 12/16/2018  ALT 15 12/16/2018   AST 15 12/16/2018   NA 142 12/16/2018   K 4.2 12/16/2018   CL 107 12/16/2018   CREATININE 0.88 12/16/2018   BUN 14 12/16/2018   CO2 26 12/16/2018   TSH 0.84 12/16/2018   HGBA1C 5.5 12/16/2018        Assessment & Plan:

## 2018-12-17 ENCOUNTER — Encounter: Payer: Self-pay | Admitting: Internal Medicine

## 2018-12-17 NOTE — Assessment & Plan Note (Signed)
stable overall by history and exam, recent data reviewed with pt, and pt to continue medical treatment as before,  to f/u any worsening symptoms or concerns  

## 2018-12-17 NOTE — Assessment & Plan Note (Signed)

## 2019-01-11 ENCOUNTER — Other Ambulatory Visit: Payer: Self-pay

## 2019-01-11 ENCOUNTER — Ambulatory Visit
Admission: RE | Admit: 2019-01-11 | Discharge: 2019-01-11 | Disposition: A | Discharge status: Home / Self Care | Payer: 59 | Source: Ambulatory Visit | Attending: General Surgery | Admitting: General Surgery

## 2019-01-11 DIAGNOSIS — Z1231 Encounter for screening mammogram for malignant neoplasm of breast: Secondary | ICD-10-CM | POA: Diagnosis present

## 2019-01-11 HISTORY — DX: Personal history of irradiation: Z92.3

## 2019-01-17 ENCOUNTER — Other Ambulatory Visit: Payer: Self-pay

## 2019-01-17 ENCOUNTER — Encounter: Payer: Self-pay | Admitting: General Surgery

## 2019-01-17 ENCOUNTER — Ambulatory Visit (INDEPENDENT_AMBULATORY_CARE_PROVIDER_SITE_OTHER): Payer: 59 | Admitting: General Surgery

## 2019-01-17 VITALS — BP 135/85 | HR 94 | Temp 97.0°F | Ht 64.0 in | Wt 185.7 lb

## 2019-01-17 DIAGNOSIS — Z853 Personal history of malignant neoplasm of breast: Secondary | ICD-10-CM | POA: Diagnosis not present

## 2019-01-17 NOTE — Progress Notes (Signed)
Patient ID: Jacqueline Lewis, female   DOB: [birth year], 58 y.o.   MRN: [id number]  Chief Complaint  Patient presents with  . Follow-up    mammogram    HPI Jacqueline Lewis is a 58 y.o. female who presents for a breast evaluation. The most recent mammogram was done on 01/11/19. Patient does perform regular self breast checks and gets regular mammograms done. She has no new breast problems to report.     HPI  Past Medical History:  Diagnosis Date  . Arthritis   . Basal cell cancer 2010   Left upper arm  . BRCA negative 11/17/2016   negative  . Breast cancer ([place])   . 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 Cervical disc disease   . Cervical dysplasia    young age, no treatment  . IBS (irritable bowel syndrome) 2005  . Meningitis Oct 2014  . Migraines   . Obesity, unspecified   . Osteoarthritis   . Personal history of radiation therapy   . Personal history of tobacco use, presenting hazards to health   . Special screening for malignant neoplasms, colon 2013  . Squamous cell carcinoma     Past Surgical History:  Procedure Laterality Date  . ABDOMINAL HYSTERECTOMY  2006   TAH BSO  . BREAST BIOPSY Left 2005   DCIS  . BREAST LUMPECTOMY W/ NEEDLE LOCALIZATION Left 04/2004   High Grade DCIS, ER/ PR +  . CERVICAL DISC SURGERY    . COLONOSCOPY  2008,06/2016   [name]  . LASIK Bilateral   . LIPOMA EXCISION    . LUMBAR PUNCTURE  Oct 2014  . MOHS SURGERY    . NASAL SEPTUM SURGERY  may 2014  . SKIN CANCER EXCISION     Left arm    Family History  Problem Relation Age of Onset  . Hypertension Mother   . Hyperlipidemia Mother   . Hypertension Father   . Multiple myeloma Father   . Breast cancer Maternal Aunt 90  . Diabetes Maternal Aunt   . Colon cancer Neg Hx     Social History Social History   Tobacco Use  . Smoking status: Former Smoker    Packs/day: 0.25    Types: Cigarettes    Quit date: 08/13/2016    Years since quitting: 2.4  . Smokeless tobacco:  Never Used  . Tobacco comment: Vapes  Substance Use Topics  . Alcohol use: Yes    Alcohol/week: 0.0 standard drinks    Comment: Rare  . Drug use: No    Allergies  Allergen Reactions  . Codeine Nausea Only    Current Outpatient Medications  Medication Sig Dispense Refill  . aspirin 81 MG tablet Take 81 mg by mouth daily.    . Biotin 5000 MCG TABS Take 1 tablet by mouth daily.    . Cholecalciferol (VITAMIN D) 2000 UNITS CAPS Take by mouth daily.    . clonazePAM (KLONOPIN) 1 MG tablet TAKE 0.5-1 TABLET BY MOUTH TWICE A DAY AS NEEDED 60 tablet 2  . cyclobenzaprine (FLEXERIL) 10 MG tablet Take 10 mg by mouth 3 (three) times daily as needed for muscle spasms.    Marland Kitchen doxycycline (VIBRA-TABS) 100 MG tablet Take 1 tablet (100 mg total) by mouth 2 (two) times daily. 20 tablet 0  . gabapentin (NEURONTIN) 300 MG capsule Take 300 mg by mouth daily.    Marland Kitchen HYDROcodone-acetaminophen (NORCO/VICODIN) 5-325 MG tablet Take 1 tablet by mouth daily as needed  for moderate pain. Chronic neck pain 90 tablet 0  . hyoscyamine (LEVSIN SL) 0.125 MG SL tablet Place 1 tablet (0.125 mg total) under the tongue every 6 (six) hours as needed. 30 tablet 2  . meloxicam (MOBIC) 7.5 MG tablet 1 tab by mouth twice per day as needed for pain 60 tablet 3  . Probiotic Product (PROBIOTIC PO) Take by mouth.    . rosuvastatin (CRESTOR) 10 MG tablet TAKE 1 TABLET BY MOUTH EVERY DAY 90 tablet 0  . SUMAtriptan (IMITREX) 100 MG tablet Take 1 tablet (100 mg total) by mouth every 2 (two) hours as needed for migraine. May repeat in 2 hours if headache persists or recurs., limit 10 tabs per month 30 tablet 3  . topiramate (TOPAMAX) 50 MG tablet Take 50 mg by mouth 2 (two) times daily.     . traZODone (DESYREL) 50 MG tablet TAKE 2 TABLETS BY MOUTH AT BEDTIME WHEN NECESSARY FOR INSOMNIA 180 tablet 0   No current facility-administered medications for this visit.     Review of Systems Review of Systems  Constitutional: Negative.    Respiratory: Negative.   Cardiovascular: Negative.     Blood pressure 135/85, pulse 94, temperature (!) 97 F (36.1 C), height 5' 4"  (1.626 m), weight 185 lb 11.2 oz (84.2 kg), SpO2 97 %.  Physical Exam Physical Exam Exam conducted with a chaperone present.  Constitutional:      Appearance: She is well-developed.  Eyes:     General: No scleral icterus.    Conjunctiva/sclera: Conjunctivae normal.  Neck:     Musculoskeletal: Neck supple.  Cardiovascular:     Rate and Rhythm: Normal rate and regular rhythm.     Heart sounds: Normal heart sounds.  Pulmonary:     Effort: Pulmonary effort is normal.     Breath sounds: Normal breath sounds.  Chest:     Breasts:        Right: No inverted nipple, mass, nipple discharge, skin change or tenderness.        Left: No inverted nipple, mass, nipple discharge, skin change or tenderness.    Lymphadenopathy:     Cervical: No cervical adenopathy.     Upper Body:     Right upper body: No supraclavicular or axillary adenopathy.     Left upper body: No supraclavicular or axillary adenopathy.  Skin:    General: Skin is warm and dry.  Neurological:     Mental Status: She is alert and oriented to person, place, and time.     Data Reviewed Bilateral screening mammograms dated January 11, 2019 were independently reviewed.  BI-RADS-1.  Assessment No evidence of recurrent cancer.  Plan The patient was offered the opportunity to have her annual exams completed with her PCP.  At this time she desires to continue follow-up to this office.  Patient will be asked to return to the office in one year with a bilateral screening mammogram.  HPI, Physical Exam, Assessment and Plan have been scribed under the direction and in the presence of Robert Bellow, MD  Concepcion Living, LPN  I have completed the exam and reviewed the above documentation for accuracy and completeness.  I agree with the above.  Haematologist has been used and any errors  in dictation or transcription are unintentional.  Hervey Ard, M.D., F.A.C.S.  Forest Gleason Demaris Bousquet 01/18/2019, 3:04 PM

## 2019-01-17 NOTE — Patient Instructions (Addendum)
Patient will be asked to return to the office in one year with a bilateral screening mammogram.  Continue self breast exams. Call office for any new breast issues or concerns.  You need to take at least 1200 mg of Calcium daily. 600mg  twice a day would be best.

## 2019-01-18 ENCOUNTER — Encounter: Payer: Self-pay | Admitting: General Surgery

## 2019-02-28 ENCOUNTER — Other Ambulatory Visit: Payer: Self-pay | Admitting: Internal Medicine

## 2019-03-03 ENCOUNTER — Encounter: Payer: Self-pay | Admitting: General Surgery

## 2019-03-12 ENCOUNTER — Other Ambulatory Visit: Payer: Self-pay | Admitting: Internal Medicine

## 2019-03-29 ENCOUNTER — Telehealth: Payer: Self-pay

## 2019-03-29 DIAGNOSIS — E611 Iron deficiency: Secondary | ICD-10-CM

## 2019-03-29 NOTE — Addendum Note (Signed)
Addended by: Biagio Borg on: 03/29/2019 08:23 PM   Modules accepted: Orders

## 2019-03-29 NOTE — Telephone Encounter (Signed)
Spoke to pt and gave her TSH results from her 12/2018 lab work. She stated that her dermatologist is concerned that she may have a underlying condition due to the look of her nail beds and thinning hair. Pt stated that her dermatologist suggested that she get her iron checked.   Please advise if pt needs to come in to discuss these issues with you or if she needs to follow up with her derm. about having her iron checked.    Copied from [place] [id number]. Topic: General - Call Back - No Documentation >> Mar 29, 2019 12:43 PM Erick Blinks wrote: Reason for CRM: Pt called requesting nurse call back to discuss labs, she had an appt with a dermatologist and the provider wants to know the results of her recent thyroid labs and iron labs. Please advise Best contact: [phone]

## 2019-03-29 NOTE — Telephone Encounter (Signed)
Ok iron panel ordered

## 2019-03-30 ENCOUNTER — Telehealth: Payer: Self-pay

## 2019-03-30 ENCOUNTER — Other Ambulatory Visit (INDEPENDENT_AMBULATORY_CARE_PROVIDER_SITE_OTHER): Payer: 59

## 2019-03-30 DIAGNOSIS — E611 Iron deficiency: Secondary | ICD-10-CM

## 2019-03-30 LAB — IBC PANEL
Iron: 105 ug/dL (ref 42–145)
Saturation Ratios: 30.2 % (ref 20.0–50.0)
Transferrin: 248 mg/dL (ref 212.0–360.0)

## 2019-03-30 NOTE — Telephone Encounter (Signed)
Called pt, LVM.   

## 2019-03-30 NOTE — Telephone Encounter (Signed)
-----   Message from Biagio Borg, MD sent at 03/30/2019 12:34 PM EDT ----- Ok to let pt know - iron level is normal

## 2019-05-02 ENCOUNTER — Encounter: Payer: Self-pay | Admitting: Gynecology

## 2019-05-22 ENCOUNTER — Other Ambulatory Visit: Payer: Self-pay

## 2019-05-23 ENCOUNTER — Encounter: Payer: Self-pay | Admitting: Gynecology

## 2019-05-23 ENCOUNTER — Ambulatory Visit (INDEPENDENT_AMBULATORY_CARE_PROVIDER_SITE_OTHER): Payer: 59 | Admitting: Gynecology

## 2019-05-23 VITALS — BP 122/76 | Ht 64.0 in | Wt 183.0 lb

## 2019-05-23 DIAGNOSIS — Z9189 Other specified personal risk factors, not elsewhere classified: Secondary | ICD-10-CM

## 2019-05-23 DIAGNOSIS — N952 Postmenopausal atrophic vaginitis: Secondary | ICD-10-CM | POA: Diagnosis not present

## 2019-05-23 DIAGNOSIS — Z853 Personal history of malignant neoplasm of breast: Secondary | ICD-10-CM | POA: Diagnosis not present

## 2019-05-23 DIAGNOSIS — Z01419 Encounter for gynecological examination (general) (routine) without abnormal findings: Secondary | ICD-10-CM

## 2019-05-23 NOTE — Progress Notes (Signed)
    Jacqueline Lewis 1961-02-17 [id number]        58 y.o.  G0P0 for annual gynecologic exam.  Notes some hot flushes.  It seems to correlate when she stopped taking trazodone that she was using for sleep for a number of years.  Also notes that her aunt told her that her mother may have taken medication during her pregnancy that affected fertility in the offspring noting the patient has never been able to achieve pregnancy.  The question is whether she was exposed to DES.  Past medical history,surgical history, problem list, medications, allergies, family history and social history were all reviewed and documented as reviewed in the EPIC chart.  ROS:  Performed with pertinent positives and negatives included in the history, assessment and plan.   Additional significant findings : None   Exam: Caryn Bee assistant Vitals:   05/23/19 0941  BP: 122/76  Weight: 183 lb (83 kg)  Height: 5\' 4"  (1.626 m)   Body mass index is 31.41 kg/m.  General appearance:  Normal affect, orientation and appearance. Skin: Grossly normal HEENT: Without gross lesions.  No cervical or supraclavicular adenopathy. Thyroid normal.  Lungs:  Clear without wheezing, rales or rhonchi Cardiac: RR, without RMG Abdominal:  Soft, nontender, without masses, guarding, rebound, organomegaly or hernia Breasts:  Examined lying and sitting without masses, retractions, discharge or axillary adenopathy. Pelvic:  Ext, BUS, Vagina: With atrophic changes.  Pap smear done  Adnexa: Without masses or tenderness    Anus and perineum: Normal   Rectovaginal: Normal sphincter tone without palpated masses or tenderness.    Assessment/Plan:  58 y.o. G0P0 female for annual gynecologic exam.   1. Postmenopausal.  Recent hot flushes.  Has had TSH and other normal blood work through her primary provider's office.  Status post TAH/BSO 2006 for endometriosis.  We discussed hot flushes in general and options.  She does have a history of  breast cancer.  Nonhormonal options such as Effexor discussed.  At this point though we are going to wait and see how she does independently.  If they persist to be an issue she will read present for discussion. 2. 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  Status post TAH/BSO in the past.  Will initiate annual cytology.  Pap smear done today. 3. History of left-sided breast cancer.  Status post radiation and lumpectomy.  Exam NED.  Mammography 01/2019.  Continue with annual mammography when due. 4. DEXA never.  Will plan at age 24. 65. Colonoscopy 2017.  Repeat at their recommended interval. 6. Health maintenance.  Had recent blood work done at her primary provider's office.  Follow-up 1 year, sooner as needed.   Anastasio Auerbach MD, 10:04 AM 05/23/2019

## 2019-05-23 NOTE — Patient Instructions (Signed)
Follow-up in 1 year for annual exam 

## 2019-05-23 NOTE — Addendum Note (Signed)
Addended by: Nelva Nay on: 05/23/2019 10:16 AM   Modules accepted: Orders

## 2019-05-24 LAB — PAP IG W/ RFLX HPV ASCU

## 2019-08-18 ENCOUNTER — Other Ambulatory Visit: Payer: Self-pay | Admitting: Internal Medicine

## 2019-08-18 NOTE — Telephone Encounter (Signed)
Done erx 

## 2019-12-05 ENCOUNTER — Other Ambulatory Visit: Payer: Self-pay

## 2019-12-05 DIAGNOSIS — Z1231 Encounter for screening mammogram for malignant neoplasm of breast: Secondary | ICD-10-CM

## 2019-12-08 ENCOUNTER — Other Ambulatory Visit: Payer: Self-pay | Admitting: Orthopedic Surgery

## 2019-12-08 DIAGNOSIS — M4722 Other spondylosis with radiculopathy, cervical region: Secondary | ICD-10-CM

## 2019-12-14 ENCOUNTER — Other Ambulatory Visit: Payer: Self-pay | Admitting: General Surgery

## 2019-12-14 DIAGNOSIS — Z1231 Encounter for screening mammogram for malignant neoplasm of breast: Secondary | ICD-10-CM

## 2020-01-12 ENCOUNTER — Ambulatory Visit
Admission: RE | Admit: 2020-01-12 | Discharge: 2020-01-12 | Disposition: A | Discharge status: Home / Self Care | Payer: No Typology Code available for payment source | Source: Ambulatory Visit | Attending: General Surgery | Admitting: General Surgery

## 2020-01-12 DIAGNOSIS — Z1231 Encounter for screening mammogram for malignant neoplasm of breast: Secondary | ICD-10-CM | POA: Diagnosis present

## 2020-03-09 ENCOUNTER — Other Ambulatory Visit: Payer: Self-pay | Admitting: Internal Medicine

## 2020-03-11 NOTE — Telephone Encounter (Signed)
[place] Controlled Refill by you on 09/27/19 Last OV 12/16/18 Future OV: none scheduled

## 2020-03-11 NOTE — Telephone Encounter (Signed)
Informed pt that she needed to be seen by Dr Jenny Reichmann for her annual before he would be able to refill any of her medication.  Pt verb understanding, assisted with scheduling appt for 04/19/20

## 2020-04-19 ENCOUNTER — Ambulatory Visit (INDEPENDENT_AMBULATORY_CARE_PROVIDER_SITE_OTHER): Payer: No Typology Code available for payment source | Admitting: Internal Medicine

## 2020-04-19 ENCOUNTER — Encounter: Payer: Self-pay | Admitting: Internal Medicine

## 2020-04-19 ENCOUNTER — Other Ambulatory Visit: Payer: Self-pay

## 2020-04-19 VITALS — BP 150/90 | HR 101 | Temp 98.9°F | Ht 64.0 in | Wt 183.0 lb

## 2020-04-19 DIAGNOSIS — Z0000 Encounter for general adult medical examination without abnormal findings: Secondary | ICD-10-CM

## 2020-04-19 MED ORDER — ROSUVASTATIN CALCIUM 10 MG PO TABS: 10.0000 mg | ORAL_TABLET | Freq: Every day | ORAL | 3 refills | Status: DC

## 2020-04-19 MED ORDER — MELOXICAM 7.5 MG PO TABS: ORAL_TABLET | ORAL | 3 refills | Status: AC

## 2020-04-19 MED ORDER — CLONAZEPAM 1 MG PO TABS: ORAL_TABLET | ORAL | 1 refills | Status: AC

## 2020-04-19 MED ORDER — SUMATRIPTAN SUCCINATE 100 MG PO TABS: 100.0000 mg | ORAL_TABLET | ORAL | 3 refills | Status: AC | PRN

## 2020-04-19 NOTE — Progress Notes (Addendum)
Subjective:    Patient ID: Jacqueline Lewis, female    DOB: [birth year], 59 y.o.   MRN: [id number]  HPI  Here for wellness and f/u;  Overall doing ok;  Pt denies Chest pain, worsening SOB, DOE, wheezing, orthopnea, PND, worsening LE edema, palpitations, dizziness or syncope.  Pt denies neurological change such as new headache, facial or extremity weakness.  Pt denies polydipsia, polyuria, or low sugar symptoms. Pt states overall good compliance with treatment and medications, good tolerability, and has been trying to follow appropriate diet.  Pt denies worsening depressive symptoms, suicidal ideation or panic. No fever, night sweats, wt loss, loss of appetite, or other constitutional symptoms.  Pt states good ability with ADL's, has low fall risk, home safety reviewed and adequate, no other significant changes in hearing or vision, and only occasionally active with exercise.  No new complaints Past Medical History:  Diagnosis Date  . Arthritis   . Basal cell cancer 2010   Left upper arm  . BRCA negative 11/17/2016   negative  . Breast cancer ([place])   . 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 Cervical disc disease   . Cervical dysplasia    young age, no treatment  . IBS (irritable bowel syndrome) 2005  . Meningitis Oct 2014  . Migraines   . Obesity, unspecified   . Osteoarthritis   . Personal history of radiation therapy   . Personal history of tobacco use, presenting hazards to health   . Special screening for malignant neoplasms, colon 2013  . Squamous cell carcinoma    Past Surgical History:  Procedure Laterality Date  . ABDOMINAL HYSTERECTOMY  2006   TAH BSO  . BREAST BIOPSY Left 2005   DCIS  . BREAST LUMPECTOMY Left   . BREAST LUMPECTOMY W/ NEEDLE LOCALIZATION Left 04/2004   High Grade DCIS, ER/ PR +  . CERVICAL DISC SURGERY    . COLONOSCOPY  2008,06/2016   [name]  . LASIK Bilateral   . LIPOMA EXCISION    . LUMBAR PUNCTURE  Oct 2014  . MOHS SURGERY    . NASAL  SEPTUM SURGERY  may 2014  . SKIN CANCER EXCISION     Left arm    reports that she quit smoking about 3 years ago. Her smoking use included cigarettes. She smoked 0.25 packs per day. She has never used smokeless tobacco. She reports current alcohol use. She reports that she does not use drugs. family history includes Breast cancer (age of onset: 42) in her maternal aunt; Diabetes in her maternal aunt; Hyperlipidemia in her mother; Hypertension in her father and mother; Multiple myeloma in her father. Allergies  Allergen Reactions  . Codeine Nausea Only   Current Outpatient Medications on File Prior to Visit  Medication Sig Dispense Refill  . aspirin 81 MG EC tablet Take by mouth.    . Biotin 5000 MCG TABS Take 1 tablet by mouth daily.    . Cholecalciferol (VITAMIN D) 2000 UNITS CAPS Take by mouth daily.    . cyanocobalamin 1000 MCG tablet Take 1 tablet by mouth daily.    . cyclobenzaprine (FLEXERIL) 10 MG tablet Take 10 mg by mouth 3 (three) times daily as needed for muscle spasms.    . cyclobenzaprine (FLEXERIL) 10 MG tablet SMARTSIG:1 Tablet(s) By Mouth 3 Times Daily PRN    . ferrous sulfate 325 (65 FE) MG tablet Take by mouth.    . gabapentin (NEURONTIN) 300 MG capsule Take 300  mg by mouth daily.    Marland Kitchen Galcanezumab-gnlm (EMGALITY) 120 MG/ML SOAJ Inject into the skin.    . hyoscyamine (LEVSIN SL) 0.125 MG SL tablet Place 1 tablet (0.125 mg total) under the tongue every 6 (six) hours as needed. 30 tablet 2  . Probiotic Product (PROBIOTIC PO) Take by mouth.    . topiramate (TOPAMAX) 50 MG tablet Take 50 mg by mouth 2 (two) times daily.      No current facility-administered medications on file prior to visit.   Review of Systems All otherwise neg per pt    Objective:   Physical Exam BP (!) 150/90 (BP Location: Left Arm, Patient Position: Sitting, Cuff Size: Large)   Pulse (!) 101   Temp 98.9 F (37.2 C) (Oral)   Ht 5' 4"  (1.626 m)   Wt 183 lb (83 kg)   SpO2 97%   BMI 31.41 kg/m    VS noted,  Constitutional: Pt appears in NAD HENT: Head: NCAT.  Right Ear: External ear normal.  Left Ear: External ear normal.  Eyes: . Pupils are equal, round, and reactive to light. Conjunctivae and EOM are normal Nose: without d/c or deformity Neck: Neck supple. Gross normal ROM Cardiovascular: Normal rate and regular rhythm.   Pulmonary/Chest: Effort normal and breath sounds without rales or wheezing.  Abd:  Soft, NT, ND, + BS, no organomegaly Neurological: Pt is alert. At baseline orientation, motor grossly intact Skin: Skin is warm. No rashes, other new lesions, no LE edema Psychiatric: Pt behavior is normal without agitation  All otherwise neg per pt Lab Results  Component Value Date   WBC 9.4 12/16/2018   HGB 14.7 12/16/2018   HCT 43.0 12/16/2018   PLT 303.0 12/16/2018   GLUCOSE 88 12/16/2018   CHOL 144 12/16/2018   TRIG 86.0 12/16/2018   HDL 81.60 12/16/2018   LDLCALC 45 12/16/2018   ALT 15 12/16/2018   AST 15 12/16/2018   NA 142 12/16/2018   K 4.2 12/16/2018   CL 107 12/16/2018   CREATININE 0.88 12/16/2018   BUN 14 12/16/2018   CO2 26 12/16/2018   TSH 0.84 12/16/2018   HGBA1C 5.5 12/16/2018      Assessment & Plan:

## 2020-04-19 NOTE — Patient Instructions (Signed)

## 2020-04-20 ENCOUNTER — Encounter: Payer: Self-pay | Admitting: Internal Medicine

## 2020-04-20 NOTE — Assessment & Plan Note (Signed)

## 2020-05-06 ENCOUNTER — Telehealth: Payer: Self-pay | Admitting: Internal Medicine

## 2020-05-06 NOTE — Telephone Encounter (Signed)
Forms have been completed & Placed in providers box to review and sign.  

## 2020-05-06 NOTE — Telephone Encounter (Signed)
One set of forms are for not being able to get COVID vaccine by 06/02/20 due to + COVID.  Other set is for +COVID on 9/28 and out of work starting 9/30 to 10/12.

## 2020-05-06 NOTE — Telephone Encounter (Signed)
I received FMLA forms for patient.

## 2020-05-06 NOTE — Telephone Encounter (Signed)
Patient tested positive for covid, is asking for something to be called in for cough  [id number] - [place], [place] Phone:  [phone]  Fax:  [phone]

## 2020-05-07 MED ORDER — HYDROCODONE-HOMATROPINE 5-1.5 MG/5ML PO SYRP: 5.0000 mL | ORAL_SOLUTION | Freq: Four times a day (QID) | ORAL | 0 refills | Status: AC | PRN

## 2020-05-07 NOTE — Telephone Encounter (Signed)
Ok done erx 

## 2020-05-07 NOTE — Telephone Encounter (Signed)
Sent to Dr. John. 

## 2020-05-08 DIAGNOSIS — Z0279 Encounter for issue of other medical certificate: Secondary | ICD-10-CM

## 2020-05-08 NOTE — Telephone Encounter (Signed)
Forms have been signed, Faxed to [phone], Copy sent to scan &Charged for.   Patient has been informed and original mailed to patient for her records.

## 2020-05-13 NOTE — Telephone Encounter (Signed)
Return to work form has been completed &faxed to [place] office.

## 2020-12-19 ENCOUNTER — Other Ambulatory Visit: Payer: Self-pay | Admitting: General Surgery

## 2020-12-19 DIAGNOSIS — Z1231 Encounter for screening mammogram for malignant neoplasm of breast: Secondary | ICD-10-CM

## 2021-04-11 ENCOUNTER — Other Ambulatory Visit: Payer: Self-pay | Admitting: Internal Medicine

## 2021-04-11 NOTE — Telephone Encounter (Signed)
Please refill as per office routine med refill policy (all routine meds to be refilled for 3 mo or monthly (per pt preference) up to one year from last visit, then month to month grace period for 3 mo, then further med refills will have to be denied) ? ?

## 2021-07-15 ENCOUNTER — Other Ambulatory Visit: Payer: Self-pay | Admitting: Internal Medicine

## 2021-07-15 NOTE — Telephone Encounter (Signed)
Please refill as per office routine med refill policy (all routine meds to be refilled for 3 mo or monthly (per pt preference) up to one year from last visit, then month to month grace period for 3 mo, then further med refills will have to be denied) ? ?

## 2021-11-04 ENCOUNTER — Encounter: Payer: Self-pay | Admitting: Gastroenterology

## 2022-01-19 IMAGING — MG DIGITAL SCREENING BILAT W/ TOMO W/ CAD
8 series · 9 of 24 positions shown · non-contrast
Comparison: Previous exam(s).

CLINICAL DATA: Screening.

EXAM:
DIGITAL SCREENING BILATERAL MAMMOGRAM WITH TOMO AND CAD

[R MLO synth-2D]
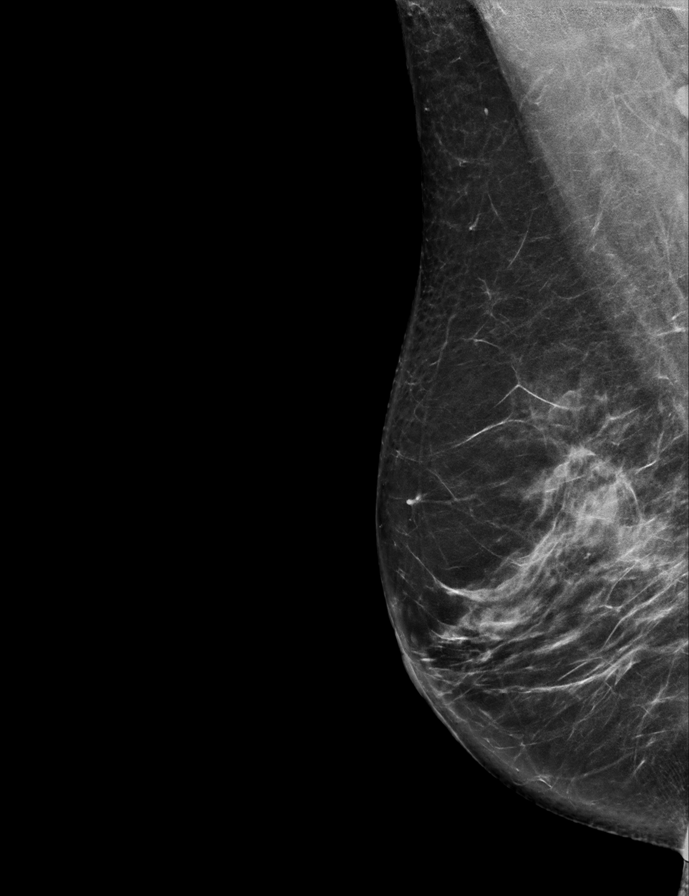

[L CC synth-2D]
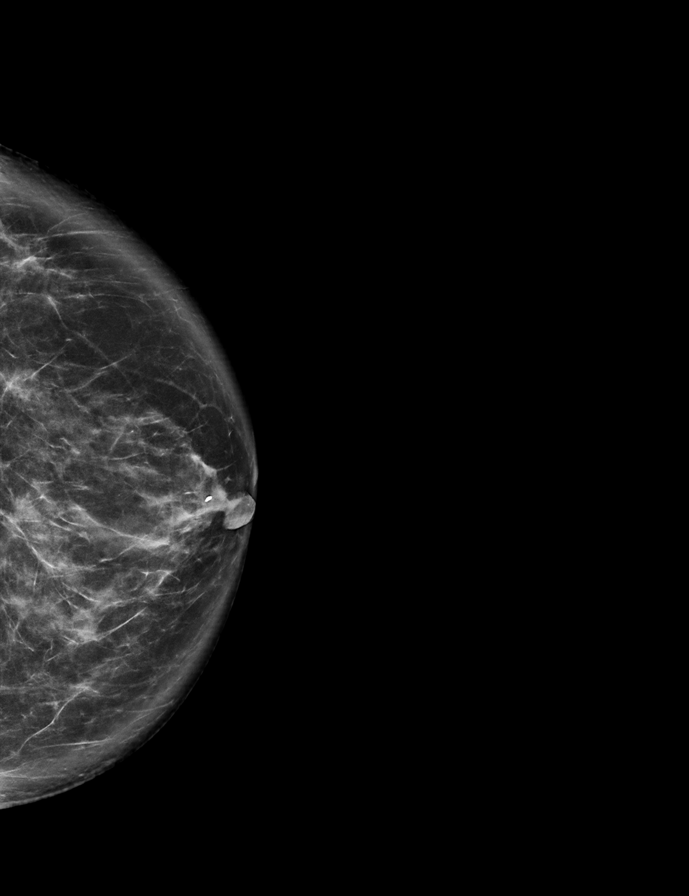

[L MLO synth-2D]
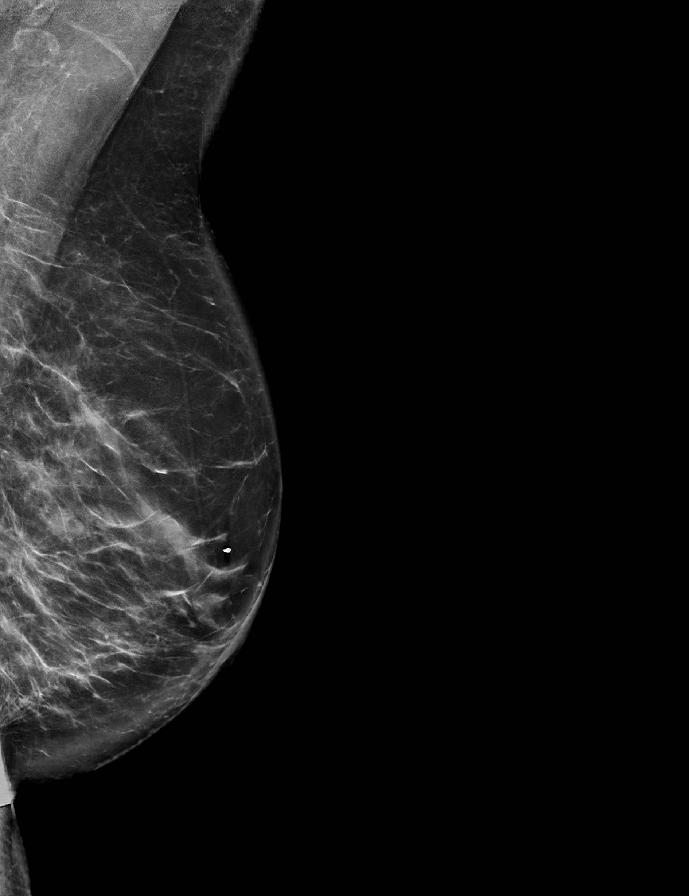

[R CC synth-2D]
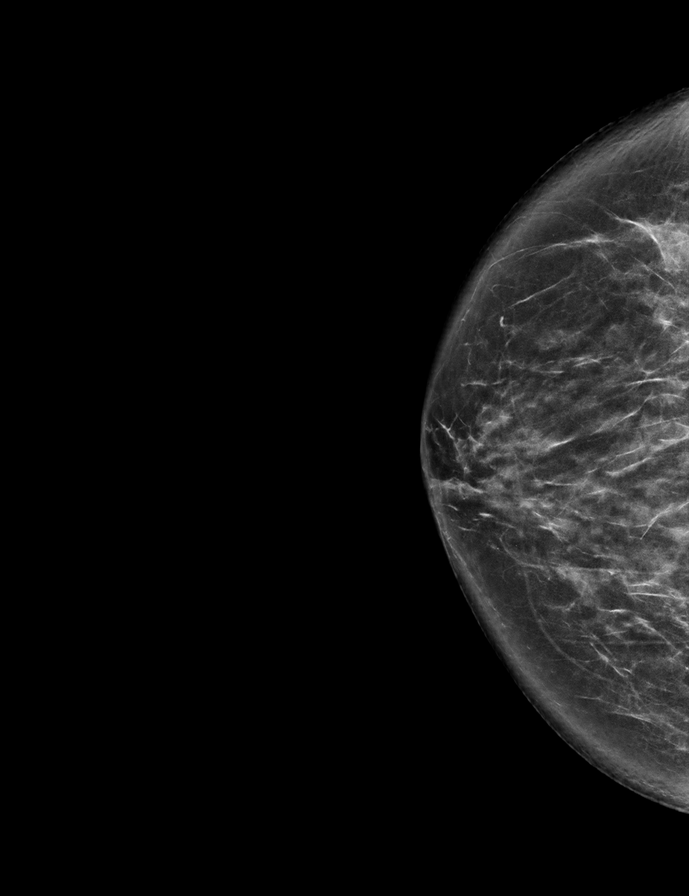

[L MLO tomo · 2 of 82 frames shown]
[frame 27/82]
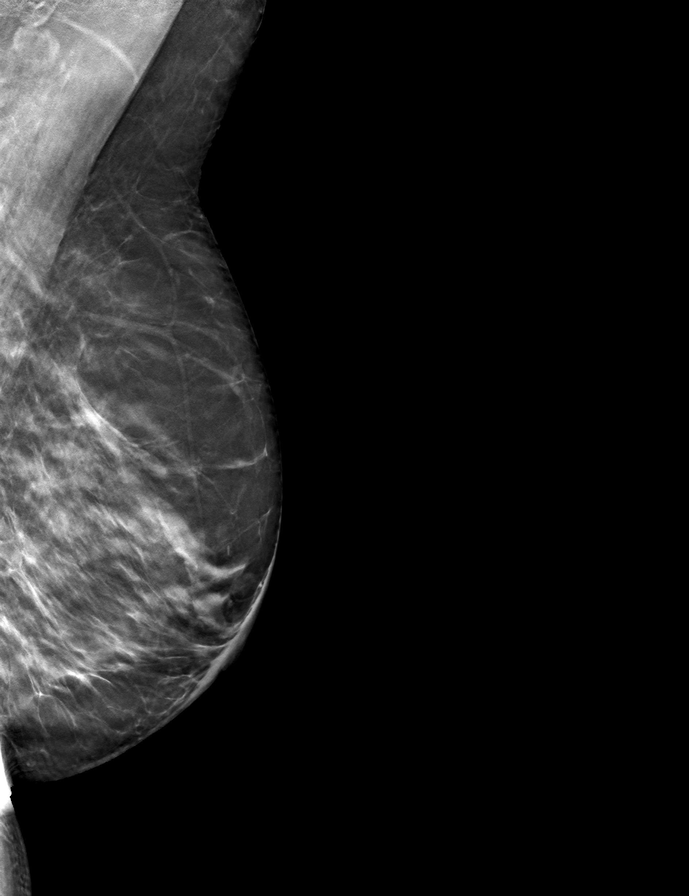
[frame 41/82]
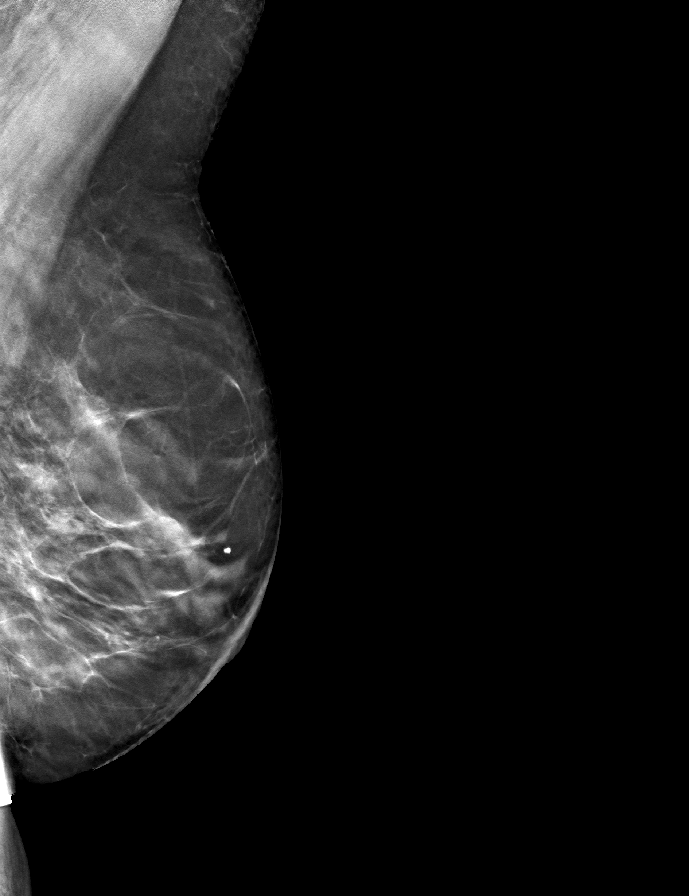

[R MLO tomo · tomo slice 39/78.0]
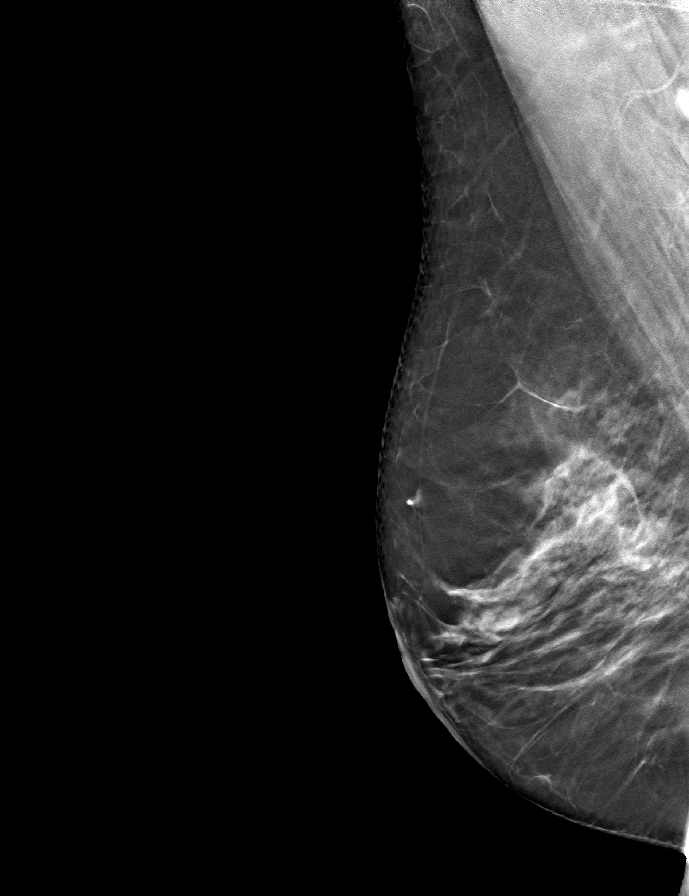

[R CC tomo · tomo slice 39/77.0]
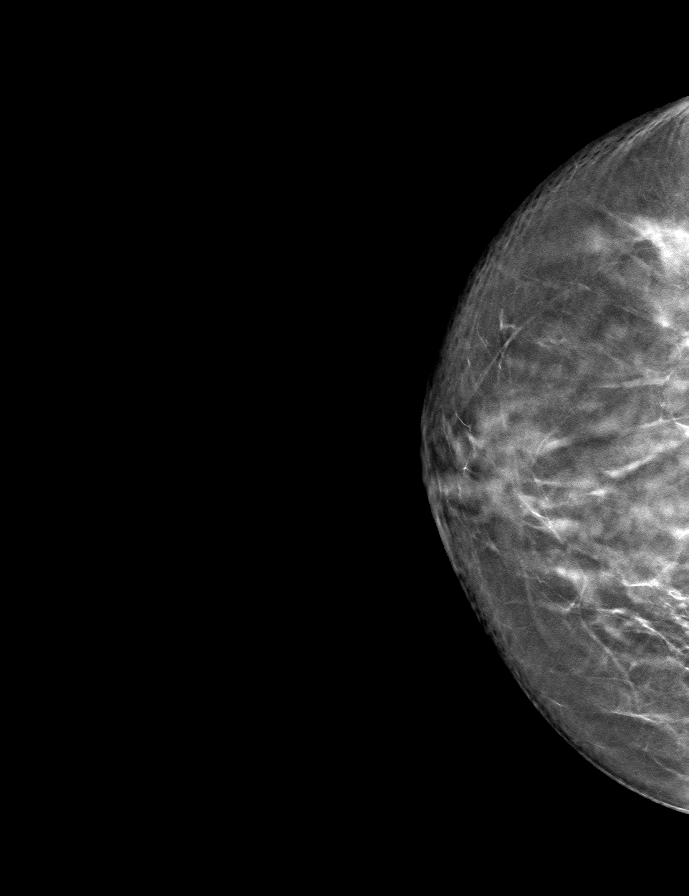

[L CC tomo · tomo slice 42/83.0]
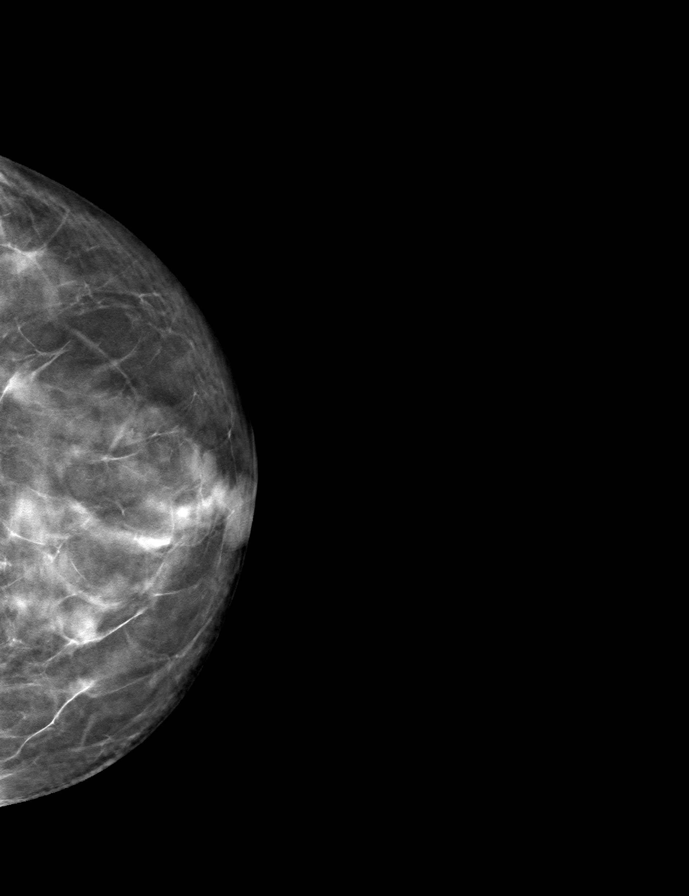

[9 of 24 positions shown; findings below may reference images not displayed]

ACR Breast Density Category c: The breast tissue is heterogeneously
dense, which may obscure small masses.
FINDINGS: There are no findings suspicious for malignancy. Images were
processed with CAD.
IMPRESSION: No mammographic evidence of malignancy. A result letter of this
screening mammogram will be mailed directly to the patient.

RECOMMENDATION:
Screening mammogram in one year. (Code:FT-U-LHB)

BI-RADS CATEGORY  1: Negative.

## 2023-06-23 ENCOUNTER — Encounter: Payer: Self-pay | Admitting: Gastroenterology

## 2024-07-03 ENCOUNTER — Other Ambulatory Visit: Payer: Self-pay | Admitting: Internal Medicine

## 2024-07-03 DIAGNOSIS — Z1231 Encounter for screening mammogram for malignant neoplasm of breast: Secondary | ICD-10-CM

## 2024-08-23 ENCOUNTER — Encounter: Payer: Self-pay | Admitting: Gastroenterology

## 2024-09-13 ENCOUNTER — Encounter

## 2024-09-27 ENCOUNTER — Encounter: Admitting: Gastroenterology
